# Patient Record
Sex: Female | Born: 1970 | Race: Black or African American | Hispanic: No | Marital: Single | Smoking: Never smoker
Health system: Southern US, Community
[De-identification: ages and names within clinical notes are randomized; demographics above are authoritative.]

## PROBLEM LIST (undated history)

## (undated) ENCOUNTER — Inpatient Hospital Stay (HOSPITAL_COMMUNITY): Payer: Self-pay

## (undated) DIAGNOSIS — R51 Headache: Secondary | ICD-10-CM

## (undated) DIAGNOSIS — I10 Essential (primary) hypertension: Secondary | ICD-10-CM

## (undated) DIAGNOSIS — K5900 Constipation, unspecified: Secondary | ICD-10-CM

## (undated) HISTORY — PX: NO PAST SURGERIES: SHX2092

---

## 1997-08-12 ENCOUNTER — Inpatient Hospital Stay (HOSPITAL_COMMUNITY): Admission: AD | Admit: 1997-08-12 | Discharge: 1997-08-12 | Payer: Self-pay | Admitting: Obstetrics

## 1997-08-13 ENCOUNTER — Inpatient Hospital Stay (HOSPITAL_COMMUNITY): Admission: AD | Admit: 1997-08-13 | Discharge: 1997-08-13 | Payer: Self-pay | Admitting: Obstetrics & Gynecology

## 1997-08-17 ENCOUNTER — Inpatient Hospital Stay (HOSPITAL_COMMUNITY): Admission: AD | Admit: 1997-08-17 | Discharge: 1997-08-17 | Payer: Self-pay | Admitting: Obstetrics

## 1997-11-09 ENCOUNTER — Inpatient Hospital Stay (HOSPITAL_COMMUNITY): Admission: AD | Admit: 1997-11-09 | Discharge: 1997-11-09 | Payer: Self-pay | Admitting: Obstetrics & Gynecology

## 1997-11-13 ENCOUNTER — Other Ambulatory Visit: Admission: RE | Admit: 1997-11-13 | Discharge: 1997-11-13 | Payer: Self-pay | Admitting: Obstetrics and Gynecology

## 1998-02-07 ENCOUNTER — Inpatient Hospital Stay (HOSPITAL_COMMUNITY): Admission: AD | Admit: 1998-02-07 | Discharge: 1998-02-07 | Payer: Self-pay | Admitting: Obstetrics and Gynecology

## 1998-03-08 ENCOUNTER — Ambulatory Visit (HOSPITAL_COMMUNITY): Admission: RE | Admit: 1998-03-08 | Discharge: 1998-03-08 | Payer: Self-pay | Admitting: Obstetrics and Gynecology

## 1998-03-10 ENCOUNTER — Ambulatory Visit (HOSPITAL_COMMUNITY): Admission: RE | Admit: 1998-03-10 | Discharge: 1998-03-10 | Payer: Self-pay | Admitting: Obstetrics and Gynecology

## 1998-05-20 ENCOUNTER — Inpatient Hospital Stay (HOSPITAL_COMMUNITY): Admission: AD | Admit: 1998-05-20 | Discharge: 1998-05-22 | Payer: Self-pay | Admitting: Obstetrics and Gynecology

## 1998-08-04 ENCOUNTER — Inpatient Hospital Stay (HOSPITAL_COMMUNITY): Admission: AD | Admit: 1998-08-04 | Discharge: 1998-08-04 | Payer: Self-pay | Admitting: Obstetrics

## 1998-11-18 ENCOUNTER — Inpatient Hospital Stay (HOSPITAL_COMMUNITY): Admission: AD | Admit: 1998-11-18 | Discharge: 1998-11-18 | Payer: Self-pay | Admitting: Obstetrics & Gynecology

## 1998-12-01 ENCOUNTER — Emergency Department (HOSPITAL_COMMUNITY): Admission: EM | Admit: 1998-12-01 | Discharge: 1998-12-01 | Payer: Self-pay | Admitting: Emergency Medicine

## 2000-09-27 ENCOUNTER — Encounter (INDEPENDENT_AMBULATORY_CARE_PROVIDER_SITE_OTHER): Payer: Self-pay | Admitting: Specialist

## 2000-09-27 ENCOUNTER — Other Ambulatory Visit: Admission: RE | Admit: 2000-09-27 | Discharge: 2000-09-27 | Payer: Self-pay | Admitting: Obstetrics and Gynecology

## 2001-08-07 ENCOUNTER — Other Ambulatory Visit: Admission: RE | Admit: 2001-08-07 | Discharge: 2001-08-07 | Payer: Self-pay | Admitting: Obstetrics and Gynecology

## 2002-08-27 ENCOUNTER — Other Ambulatory Visit: Admission: RE | Admit: 2002-08-27 | Discharge: 2002-08-27 | Payer: Self-pay | Admitting: Obstetrics and Gynecology

## 2002-08-31 ENCOUNTER — Emergency Department (HOSPITAL_COMMUNITY): Admission: EM | Admit: 2002-08-31 | Discharge: 2002-08-31 | Payer: Self-pay | Admitting: Emergency Medicine

## 2003-11-18 ENCOUNTER — Emergency Department (HOSPITAL_COMMUNITY): Admission: EM | Admit: 2003-11-18 | Discharge: 2003-11-18 | Payer: Self-pay | Admitting: Family Medicine

## 2004-04-05 ENCOUNTER — Other Ambulatory Visit: Admission: RE | Admit: 2004-04-05 | Discharge: 2004-04-05 | Payer: Self-pay | Admitting: Obstetrics and Gynecology

## 2004-09-21 ENCOUNTER — Encounter (INDEPENDENT_AMBULATORY_CARE_PROVIDER_SITE_OTHER): Payer: Self-pay | Admitting: *Deleted

## 2004-09-21 ENCOUNTER — Inpatient Hospital Stay (HOSPITAL_COMMUNITY): Admission: AD | Admit: 2004-09-21 | Discharge: 2004-09-23 | Payer: Self-pay | Admitting: Obstetrics and Gynecology

## 2004-10-15 ENCOUNTER — Emergency Department (HOSPITAL_COMMUNITY): Admission: EM | Admit: 2004-10-15 | Discharge: 2004-10-15 | Payer: Self-pay | Admitting: Emergency Medicine

## 2005-12-30 ENCOUNTER — Emergency Department (HOSPITAL_COMMUNITY): Admission: EM | Admit: 2005-12-30 | Discharge: 2005-12-31 | Payer: Self-pay | Admitting: Emergency Medicine

## 2006-04-19 ENCOUNTER — Emergency Department (HOSPITAL_COMMUNITY): Admission: EM | Admit: 2006-04-19 | Discharge: 2006-04-20 | Payer: Self-pay | Admitting: Emergency Medicine

## 2006-04-24 ENCOUNTER — Emergency Department (HOSPITAL_COMMUNITY): Admission: EM | Admit: 2006-04-24 | Discharge: 2006-04-24 | Payer: Self-pay | Admitting: Family Medicine

## 2007-06-10 ENCOUNTER — Emergency Department (HOSPITAL_COMMUNITY): Admission: EM | Admit: 2007-06-10 | Discharge: 2007-06-10 | Payer: Self-pay | Admitting: Emergency Medicine

## 2008-09-16 ENCOUNTER — Emergency Department (HOSPITAL_COMMUNITY): Admission: EM | Admit: 2008-09-16 | Discharge: 2008-09-16 | Payer: Self-pay | Admitting: Emergency Medicine

## 2010-03-20 ENCOUNTER — Emergency Department (HOSPITAL_COMMUNITY): Admission: EM | Admit: 2010-03-20 | Discharge: 2010-03-20 | Payer: Self-pay | Admitting: Emergency Medicine

## 2010-07-20 ENCOUNTER — Emergency Department (HOSPITAL_COMMUNITY)
Admission: EM | Admit: 2010-07-20 | Discharge: 2010-07-20 | Payer: Self-pay | Source: Home / Self Care | Admitting: Emergency Medicine

## 2010-07-20 LAB — POCT URINALYSIS DIPSTICK
Ketones, ur: NEGATIVE mg/dL
Nitrite: NEGATIVE
Protein, ur: NEGATIVE mg/dL
Urine Glucose, Fasting: NEGATIVE mg/dL
Urobilinogen, UA: 0.2 mg/dL (ref 0.0–1.0)

## 2010-09-08 LAB — URINALYSIS, ROUTINE W REFLEX MICROSCOPIC
Nitrite: NEGATIVE
Specific Gravity, Urine: 1.021 (ref 1.005–1.030)
Urobilinogen, UA: 0.2 mg/dL (ref 0.0–1.0)

## 2010-09-08 LAB — GC/CHLAMYDIA PROBE AMP, GENITAL: Chlamydia, DNA Probe: NEGATIVE

## 2010-09-08 LAB — URINE MICROSCOPIC-ADD ON

## 2010-09-08 LAB — POCT PREGNANCY, URINE: Preg Test, Ur: NEGATIVE

## 2010-09-08 LAB — WET PREP, GENITAL: Yeast Wet Prep HPF POC: NONE SEEN

## 2010-11-11 NOTE — Discharge Summary (Signed)
NAMEMANHATTAN, MCCUEN               ACCOUNT NO.:  [id number]   MEDICAL RECORD NO.:  [id number]          PATIENT TYPE:  INP   LOCATION:  9141                          FACILITY:  [facility]   PHYSICIAN:  Malachi Pro. Ambrose Mantle, M.D. DATE OF BIRTH:  02/21/1971   DATE OF ADMISSION:  09/21/2004  DATE OF DISCHARGE:  09/23/2004                                 DISCHARGE SUMMARY   This 40 year old black female para 1-0-4-1, gravida 6, EDC/ September 25, 2004,  presented to labor and delivery with complaints of spontaneous rupture of  membranes at 3 a.m. with green fluid noted.  Some mild contractions.  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  Otherwise, no  issues.  Positive group B strep status.  Blood group and type O positive,  negative antibody.  Nonreactive serology.  Rubella immune.  Hepatitis B  surface antigen negative. HIV declined.  GC and chlamydia negative.  One-  hour Glucola 132.  Group B strep positive.   PAST OBSTETRIC HISTORY:  In 1999, spontaneous delivery of 7 pound 9 ounce  infant.  Spontaneous abortions x2.  Early abortions x2.   GYNECOLOGICAL HISTORY:  History of colposcopy, normal Paps after that.  No  medical history or surgical history.   ALLERGIES:  No known allergies.   MEDICATIONS:  No medications.   PHYSICAL EXAMINATION:  On admission, the patient was afebrile with normal  vital signs.  Heart and lungs were normal.  Abdomen soft.  Fundal height  term size.  Cervix:  2 cm with 50% vertex, and a -2.  Thick green meconium  was noted.  Dr. Senaida Ores placed an intrauterine pressure catheter and gave  amnio infusion.  The patient was started on antibiotics for her positive  group B strep status.  The patient progressed to full dilatation and pushed  well.  The NICU was called because of the thick meconium.  The patient  delivered spontaneously ROA over an intact perineum a living female infant, 7  pounds 4 ounces.  Apgars of 8 at one and 9 at five minutes.  Apgars  were  signed by Dr. Ruben Gottron.  There was one loop of nuchal cord before  delivery of the body.  The nose and pharynx were suctioned with the DeLee  and the bulb, and approximately 4 mL of greenish-red fluid were obtained  with the DeLee.  Placenta was intact and meconium stained.  Uterus was  normal.  Blood loss about 300 mL.  One small abrasion on the right labia at  10 to 11 o'clock was hemostatic and not repaired.  Postpartum, the patient  did well and was discharged on the second postpartum day.  Initial  hemoglobin 12.3, hematocrit 36.7, white count 10,700, platelet count  342,000.  Followup hemoglobin 10.2, hematocrit 30.5, RPR nonreactive.   FINAL DIAGNOSES:  Intrauterine pregnancy at 39+ weeks, delivered OA.   OPERATION:  Spontaneous delivery OA.   FINAL CONDITION:  Improved.   DISCHARGE INSTRUCTIONS:  Instructions include our regular discharge  instruction booklet.  Percocet 5/325, 20 tablets, 1 every 4-6 hours as  needed for pain  is given at discharge.  The patient is advised to return in  six weeks for followup examination.      TFH/MEDQ  D:  09/23/2004  T:  09/23/2004  Job:  [id number]

## 2011-07-28 ENCOUNTER — Encounter (HOSPITAL_COMMUNITY): Payer: Self-pay

## 2011-07-28 ENCOUNTER — Inpatient Hospital Stay (HOSPITAL_COMMUNITY)
Admission: AD | Admit: 2011-07-28 | Discharge: 2011-07-28 | Disposition: A | Discharge status: Home / Self Care | Payer: Self-pay | Source: Ambulatory Visit | Attending: Obstetrics and Gynecology | Admitting: Obstetrics and Gynecology

## 2011-07-28 DIAGNOSIS — B9689 Other specified bacterial agents as the cause of diseases classified elsewhere: Secondary | ICD-10-CM | POA: Insufficient documentation

## 2011-07-28 DIAGNOSIS — A499 Bacterial infection, unspecified: Secondary | ICD-10-CM

## 2011-07-28 DIAGNOSIS — N760 Acute vaginitis: Secondary | ICD-10-CM | POA: Insufficient documentation

## 2011-07-28 DIAGNOSIS — N949 Unspecified condition associated with female genital organs and menstrual cycle: Secondary | ICD-10-CM | POA: Insufficient documentation

## 2011-07-28 LAB — URINALYSIS, ROUTINE W REFLEX MICROSCOPIC
Bilirubin Urine: NEGATIVE
Glucose, UA: NEGATIVE mg/dL
Leukocytes, UA: NEGATIVE
Nitrite: NEGATIVE
Protein, ur: NEGATIVE mg/dL
Specific Gravity, Urine: 1.02 (ref 1.005–1.030)
pH: 6 (ref 5.0–8.0)

## 2011-07-28 LAB — WET PREP, GENITAL
Trich, Wet Prep: NONE SEEN
Yeast Wet Prep HPF POC: NONE SEEN

## 2011-07-28 LAB — URINE MICROSCOPIC-ADD ON

## 2011-07-28 MED ORDER — METRONIDAZOLE 500 MG PO TABS: 500.0000 mg | ORAL_TABLET | Freq: Two times a day (BID) | ORAL | Status: AC

## 2011-07-28 NOTE — Progress Notes (Signed)
Has a discharge, with an odor.  First noted about a month ago.  Thought it was cycle, but that came and went again.

## 2011-07-28 NOTE — ED Provider Notes (Signed)
History     Chief Complaint  Patient presents with  . Vaginal Discharge   HPI Melody Salas 41 y.o. LMP 07-16-11, Has been having watery discharge with odor.  Has been wearing a pad.  No other complaints.   History reviewed. No pertinent past medical history.  History reviewed. No pertinent past surgical history.  History reviewed. No pertinent family history.  History  Substance Use Topics  . Smoking status: Never Smoker   . Smokeless tobacco: Not on file  . Alcohol Use: No    Allergies: No Known Allergies  Prescriptions prior to admission  Medication Sig Dispense Refill  . ibuprofen (ADVIL,MOTRIN) 200 MG tablet Take 800 mg by mouth every 6 (six) hours as needed. Takes for headaches        Review of Systems  Gastrointestinal: Negative for abdominal pain.  Genitourinary: Negative for dysuria.       Vaginal discharge   Physical Exam   Blood pressure 139/91, pulse 89, temperature 98.7 F (37.1 C), temperature source Oral, resp. rate 20, height 5\' 7"  (1.702 m), weight 170 lb (77.111 kg), last menstrual period 07/16/2011, SpO2 99.00%.  Physical Exam  Nursing note and vitals reviewed. Constitutional: She is oriented to person, place, and time. She appears well-developed and well-nourished.  HENT:  Head: Normocephalic.  Eyes: EOM are normal.  Neck: Neck supple.  GI: Soft. There is no tenderness.  Genitourinary:       Speculum exam: Vagina - large amount of watery yellow discharge, no odor Cervix - No contact bleeding Bimanual exam: Cervix closed Uterus non tender, normal size Adnexa non tender, no masses bilaterally GC/Chlam, wet prep done Chaperone present for exam.  Musculoskeletal: Normal range of motion.  Neurological: She is alert and oriented to person, place, and time.  Skin: Skin is warm and dry.  Psychiatric: She has a normal mood and affect.    MAU Course  Procedures  MDM Results for orders placed during the hospital encounter of 07/28/11  (from the past 24 hour(s))  URINALYSIS, ROUTINE W REFLEX MICROSCOPIC     Status: Abnormal   Collection Time   07/28/11  8:13 PM      Component Value Range   Color, Urine YELLOW  YELLOW    APPearance CLEAR  CLEAR    Specific Gravity, Urine 1.020  1.005 - 1.030    pH 6.0  5.0 - 8.0    Glucose, UA NEGATIVE  NEGATIVE (mg/dL)   Hgb urine dipstick TRACE (*) NEGATIVE    Bilirubin Urine NEGATIVE  NEGATIVE    Ketones, ur NEGATIVE  NEGATIVE (mg/dL)   Protein, ur NEGATIVE  NEGATIVE (mg/dL)   Urobilinogen, UA 1.0  0.0 - 1.0 (mg/dL)   Nitrite NEGATIVE  NEGATIVE    Leukocytes, UA NEGATIVE  NEGATIVE   URINE MICROSCOPIC-ADD ON     Status: Abnormal   Collection Time   07/28/11  8:13 PM      Component Value Range   Squamous Epithelial / LPF FEW (*) RARE    RBC / HPF 3-6  <3 (RBC/hpf)   Urine-Other MUCOUS PRESENT    WET PREP, GENITAL     Status: Abnormal   Collection Time   07/28/11  9:35 PM      Component Value Range   Yeast Wet Prep HPF POC NONE SEEN  NONE SEEN    Trich, Wet Prep NONE SEEN  NONE SEEN    Clue Cells Wet Prep HPF POC MANY (*) NONE SEEN    WBC,  Wet Prep HPF POC MODERATE (*) NONE SEEN      Assessment and Plan  Bacterial vaginosis  Plan Metronidazole 500 mg po bid x 7 days No alcohol while taking medication. See your doctor if you have any further problems.  Melody Salas 07/28/2011, 9:36 PM   Nolene Bernheim, NP 07/28/11 2232

## 2011-07-28 NOTE — Progress Notes (Signed)
Patient is here with c/o vaginal discharge with odor and itching. She denies any pain

## 2011-07-28 NOTE — ED Notes (Signed)
Called, not in lobby 

## 2011-07-29 LAB — GC/CHLAMYDIA PROBE AMP, GENITAL: Chlamydia, DNA Probe: NEGATIVE

## 2011-07-31 LAB — POCT PREGNANCY, URINE: Preg Test, Ur: NEGATIVE

## 2012-05-07 DIAGNOSIS — B9689 Other specified bacterial agents as the cause of diseases classified elsewhere: Secondary | ICD-10-CM

## 2012-05-07 DIAGNOSIS — A499 Bacterial infection, unspecified: Secondary | ICD-10-CM

## 2012-05-07 DIAGNOSIS — N760 Acute vaginitis: Secondary | ICD-10-CM

## 2012-05-08 ENCOUNTER — Encounter (HOSPITAL_COMMUNITY): Payer: Self-pay | Admitting: *Deleted

## 2012-05-08 ENCOUNTER — Inpatient Hospital Stay (HOSPITAL_COMMUNITY)
Admission: AD | Admit: 2012-05-08 | Discharge: 2012-05-08 | Disposition: A | Discharge status: Home / Self Care | Payer: Self-pay | Source: Ambulatory Visit | Attending: Obstetrics & Gynecology | Admitting: Obstetrics & Gynecology

## 2012-05-08 DIAGNOSIS — N760 Acute vaginitis: Secondary | ICD-10-CM | POA: Insufficient documentation

## 2012-05-08 DIAGNOSIS — B9689 Other specified bacterial agents as the cause of diseases classified elsewhere: Secondary | ICD-10-CM | POA: Insufficient documentation

## 2012-05-08 DIAGNOSIS — A499 Bacterial infection, unspecified: Secondary | ICD-10-CM | POA: Insufficient documentation

## 2012-05-08 DIAGNOSIS — L293 Anogenital pruritus, unspecified: Secondary | ICD-10-CM | POA: Insufficient documentation

## 2012-05-08 DIAGNOSIS — N949 Unspecified condition associated with female genital organs and menstrual cycle: Secondary | ICD-10-CM | POA: Insufficient documentation

## 2012-05-08 LAB — WET PREP, GENITAL: Yeast Wet Prep HPF POC: NONE SEEN

## 2012-05-08 MED ORDER — METRONIDAZOLE 500 MG PO TABS: 500.0000 mg | ORAL_TABLET | Freq: Two times a day (BID) | ORAL | Status: DC

## 2012-05-08 NOTE — MAU Provider Note (Signed)
History     CSN: [id number]  Arrival date and time: 05/08/12 1712   First Provider Initiated Contact with Patient 05/08/12 1756      Chief Complaint  Patient presents with  . Vaginal Discharge   HPI Melody Salas is a 41 y.o. female who presents to MAU with vaginal discharge. The discharge started over a week ago.  She describes the discharge as watery with a bad odor. Current sex partner for several years. No birth control. The history was provided by the patient.  OB History    Grav Para Term Preterm Abortions TAB SAB Ect Mult Living   4 2 2  2 2    2       History reviewed. No pertinent past medical history.  Past Surgical History  Procedure Date  . No past surgeries     Family History  Problem Relation Age of Onset  . Other Neg Hx     History  Substance Use Topics  . Smoking status: Never Smoker   . Smokeless tobacco: Not on file  . Alcohol Use: No    Allergies: No Known Allergies  Prescriptions prior to admission  Medication Sig Dispense Refill  . aspirin-acetaminophen-caffeine (EXCEDRIN MIGRAINE) 250-250-65 MG per tablet Take 2 tablets by mouth every 6 (six) hours as needed. headaches        Review of Systems  Constitutional: Negative for fever and chills.  Eyes: Negative for blurred vision and double vision.  Respiratory: Negative for cough and wheezing.   Cardiovascular: Negative for chest pain and palpitations.  Gastrointestinal: Negative for nausea, vomiting and abdominal pain.  Genitourinary: Negative for dysuria, urgency and frequency.       Vaginal discharge  Musculoskeletal: Negative for back pain.  Skin: Negative for rash.  Neurological: Negative for dizziness and headaches.  Psychiatric/Behavioral: Negative for depression. The patient is not nervous/anxious.    Physical Exam   Blood pressure 141/89, pulse 81, temperature 97.8 F (36.6 C), temperature source Oral, resp. rate 16, height 5\' 7"  (1.702 m), weight 77.168 kg (170 lb 2 oz),  last menstrual period 05/02/2012.  Physical Exam  Nursing note and vitals reviewed. Constitutional: She is oriented to person, place, and time. She appears well-developed and well-nourished. No distress.  HENT:  Head: Normocephalic and atraumatic.  Eyes: EOM are normal.  Neck: Neck supple.  Cardiovascular: Normal rate.   Respiratory: Effort normal.  GI: Soft. There is no tenderness.  Genitourinary:       External genitalia without lesions. Frothy malodorous discharge vaginal vault. No CMT, no adnexal tenderness. Uterus without palpable enlargement.  Musculoskeletal: Normal range of motion.  Neurological: She is alert and oriented to person, place, and time.  Skin: Skin is warm and dry.  Psychiatric: She has a normal mood and affect. Her behavior is normal. Judgment and thought content normal.   Results for orders placed during the hospital encounter of 05/08/12 (from the past 24 hour(s))  WET PREP, GENITAL     Status: Abnormal   Collection Time   05/08/12  6:05 PM      Component Value Range   Yeast Wet Prep HPF POC NONE SEEN  NONE SEEN   Trich, Wet Prep NONE SEEN  NONE SEEN   Clue Cells Wet Prep HPF POC FEW (*) NONE SEEN   WBC, Wet Prep HPF POC FEW (*) NONE SEEN   Assessment: 41 y.o. female with vaginal discharge   Bacterial vaginosis  Plan:  Rx Flagyl   Follow up with  GYN Clinic, return as needed. Discussed with the patient and all questioned fully answered. She will return if any problems arise.   Medication List     As of 05/09/2012  2:25 PM    START taking these medications         metroNIDAZOLE 500 MG tablet   Commonly known as: FLAGYL   Take 1 tablet (500 mg total) by mouth 2 (two) times daily.      CONTINUE taking these medications         aspirin-acetaminophen-caffeine 250-250-65 MG per tablet   Commonly known as: EXCEDRIN MIGRAINE          Where to get your medications    These are the prescriptions that you need to pick up. We sent them to a specific  pharmacy, so you will need to go there to get them.   [id number] - [place], [place] - [address] AT [facility] [address]    [address] [place] [address]    Phone: [phone]        metroNIDAZOLE 500 MG tablet           MAU Course  Procedures  NEESE,HOPE, RN, FNP, [facility] 05/08/2012, 5:57 PM

## 2012-05-08 NOTE — MAU Note (Signed)
Pt states has thin watery discharge x1-2 weeks with odor noted. Some itching externally. Has had bv before and thinks this may be the same infection.

## 2012-05-10 LAB — GC/CHLAMYDIA PROBE AMP, GENITAL: Chlamydia, DNA Probe: NEGATIVE

## 2012-09-28 ENCOUNTER — Inpatient Hospital Stay (HOSPITAL_COMMUNITY)
Admission: AD | Admit: 2012-09-28 | Discharge: 2012-09-28 | Disposition: A | Discharge status: Home / Self Care | Payer: Self-pay | Source: Ambulatory Visit | Attending: Family Medicine | Admitting: Family Medicine

## 2012-09-28 ENCOUNTER — Encounter (HOSPITAL_COMMUNITY): Payer: Self-pay | Admitting: *Deleted

## 2012-09-28 DIAGNOSIS — N760 Acute vaginitis: Secondary | ICD-10-CM | POA: Insufficient documentation

## 2012-09-28 DIAGNOSIS — B9689 Other specified bacterial agents as the cause of diseases classified elsewhere: Secondary | ICD-10-CM | POA: Insufficient documentation

## 2012-09-28 DIAGNOSIS — A499 Bacterial infection, unspecified: Secondary | ICD-10-CM

## 2012-09-28 DIAGNOSIS — N899 Noninflammatory disorder of vagina, unspecified: Secondary | ICD-10-CM | POA: Insufficient documentation

## 2012-09-28 HISTORY — DX: Headache: R51

## 2012-09-28 LAB — URINALYSIS, ROUTINE W REFLEX MICROSCOPIC
Ketones, ur: NEGATIVE mg/dL
Protein, ur: NEGATIVE mg/dL
Specific Gravity, Urine: 1.015 (ref 1.005–1.030)
Urobilinogen, UA: 0.2 mg/dL (ref 0.0–1.0)

## 2012-09-28 LAB — WET PREP, GENITAL
Trich, Wet Prep: NONE SEEN
Yeast Wet Prep HPF POC: NONE SEEN

## 2012-09-28 LAB — URINE MICROSCOPIC-ADD ON

## 2012-09-28 MED ORDER — METRONIDAZOLE 500 MG PO TABS: 500.0000 mg | ORAL_TABLET | Freq: Two times a day (BID) | ORAL | Status: DC

## 2012-09-28 NOTE — MAU Provider Note (Signed)
Chart reviewed and agree with management and plan.  

## 2012-09-28 NOTE — MAU Note (Signed)
Pt. Here due to urgency, odor and vaginal irritation. Symptoms started 3-4 days ago. Denies hx. Of UTI. Denies bleeding. Urine pregnancy negative.

## 2012-09-28 NOTE — MAU Provider Note (Signed)
History     CSN: [id number]  Arrival date and time: 09/28/12 1825   First Provider Initiated Contact with Patient 09/28/12 1904      No chief complaint on file.  HPI [name] is a 42 y.o. [phone] who presents to MAU today with complaint of vaginal irritation. The patient states that this has been going on occasionally x 3-4 days. She has noticed a yellow, thin discharge. She denies vaginal bleeding, pelvic pain, N/V or fever. She is sexually active but uses condoms. LMP 09/04/12. She denies UTI symptoms.   OB History   Grav Para Term Preterm Abortions TAB SAB Ect Mult Living   4 2 2  2 2    2       Past Medical History  Diagnosis Date  . Headache     Past Surgical History  Procedure Laterality Date  . No past surgeries      Family History  Problem Relation Age of Onset  . Other Neg Hx   . Hypertension Mother     History  Substance Use Topics  . Smoking status: Never Smoker   . Smokeless tobacco: Not on file  . Alcohol Use: No    Allergies: No Known Allergies  Prescriptions prior to admission  Medication Sig Dispense Refill  . loratadine (CLARITIN) 10 MG tablet Take 10 mg by mouth daily as needed for allergies.        Review of Systems  Constitutional: Negative for fever and malaise/fatigue.  Gastrointestinal: Negative for nausea, vomiting and abdominal pain.  Genitourinary: Negative for dysuria, urgency and frequency.       + vaginal discharge Neg - vaginal bleeding   Physical Exam   Blood pressure 131/73, pulse 90, temperature 99 F (37.2 C), temperature source Oral, resp. rate 18, height 5\' 7"  (1.702 m), weight 170 lb (77.111 kg), last menstrual period 09/04/2012.  Physical Exam  Constitutional: She is oriented to person, place, and time. She appears well-developed and well-nourished. No distress.  HENT:  Head: Normocephalic and atraumatic.  Cardiovascular: Normal rate, regular rhythm and normal heart sounds.   Respiratory: Effort normal  and breath sounds normal. No respiratory distress.  GI: Soft. Bowel sounds are normal. She exhibits no distension and no mass. There is no tenderness. There is no rebound and no guarding.  Genitourinary: Uterus is not enlarged and not tender. Cervix exhibits discharge (small amount of white mucus discharge noted). Cervix exhibits no motion tenderness and no friability. Right adnexum displays no mass and no tenderness. Left adnexum displays no mass and no tenderness. No erythema, tenderness or bleeding around the vagina. Vaginal discharge (thin, white discharge noted) found.  Neurological: She is alert and oriented to person, place, and time.  Skin: Skin is warm and dry. No erythema.   Results for orders placed during the hospital encounter of 09/28/12 (from the past 24 hour(s))  URINALYSIS, ROUTINE W REFLEX MICROSCOPIC     Status: Abnormal   Collection Time    09/28/12  6:45 PM      Result Value Range   Color, Urine YELLOW  YELLOW   APPearance CLEAR  CLEAR   Specific Gravity, Urine 1.015  1.005 - 1.030   pH 6.0  5.0 - 8.0   Glucose, UA NEGATIVE  NEGATIVE mg/dL   Hgb urine dipstick TRACE (*) NEGATIVE   Bilirubin Urine NEGATIVE  NEGATIVE   Ketones, ur NEGATIVE  NEGATIVE mg/dL   Protein, ur NEGATIVE  NEGATIVE mg/dL   Urobilinogen, UA 0.2  0.0 - 1.0 mg/dL   Nitrite NEGATIVE  NEGATIVE   Leukocytes, UA SMALL (*) NEGATIVE  URINE MICROSCOPIC-ADD ON     Status: Abnormal   Collection Time    09/28/12  6:45 PM      Result Value Range   Squamous Epithelial / LPF MANY (*) RARE   WBC, UA 3-6  <3 WBC/hpf   RBC / HPF 0-2  <3 RBC/hpf   Bacteria, UA FEW (*) RARE  WET PREP, GENITAL     Status: Abnormal   Collection Time    09/28/12  7:20 PM      Result Value Range   Yeast Wet Prep HPF POC NONE SEEN  NONE SEEN   Trich, Wet Prep NONE SEEN  NONE SEEN   Clue Cells Wet Prep HPF POC FEW (*) NONE SEEN   WBC, Wet Prep HPF POC MODERATE (*) NONE SEEN     MAU Course  Procedures None  MDM UA, wet  prep and GC/Chlamydia today  Assessment and Plan  A: Bacterial vaginosis  P: Discharge home Rx for Flagyl sent to patient's pharmacy Discussed hygiene products and probiotics for avoiding recurrence Patient may return to MAU as needed or if her condition were to change or worsen  Freddi Starr, PA-C  09/28/2012, 8:01 PM

## 2012-09-28 NOTE — Progress Notes (Signed)
MAU provider in room. Cultures collected and exam done.

## 2012-09-30 LAB — GC/CHLAMYDIA PROBE AMP: CT Probe RNA: NEGATIVE

## 2012-09-30 LAB — URINE CULTURE

## 2013-02-24 ENCOUNTER — Inpatient Hospital Stay (HOSPITAL_COMMUNITY)
Admission: AD | Admit: 2013-02-24 | Discharge: 2013-02-24 | Discharge status: Against Medical Advice | Payer: Self-pay | Source: Ambulatory Visit | Attending: Obstetrics and Gynecology | Admitting: Obstetrics and Gynecology

## 2013-02-24 ENCOUNTER — Inpatient Hospital Stay (HOSPITAL_COMMUNITY)
Admission: AD | Admit: 2013-02-24 | Discharge: 2013-02-24 | Disposition: A | Discharge status: Home / Self Care | Payer: Self-pay | Source: Ambulatory Visit | Attending: Obstetrics and Gynecology | Admitting: Obstetrics and Gynecology

## 2013-02-24 ENCOUNTER — Encounter (HOSPITAL_COMMUNITY): Payer: Self-pay | Admitting: *Deleted

## 2013-02-24 DIAGNOSIS — A499 Bacterial infection, unspecified: Secondary | ICD-10-CM

## 2013-02-24 DIAGNOSIS — N760 Acute vaginitis: Secondary | ICD-10-CM | POA: Insufficient documentation

## 2013-02-24 DIAGNOSIS — L293 Anogenital pruritus, unspecified: Secondary | ICD-10-CM | POA: Insufficient documentation

## 2013-02-24 DIAGNOSIS — N949 Unspecified condition associated with female genital organs and menstrual cycle: Secondary | ICD-10-CM | POA: Insufficient documentation

## 2013-02-24 DIAGNOSIS — B9689 Other specified bacterial agents as the cause of diseases classified elsewhere: Secondary | ICD-10-CM | POA: Insufficient documentation

## 2013-02-24 LAB — WET PREP, GENITAL: Trich, Wet Prep: NONE SEEN

## 2013-02-24 MED ORDER — METRONIDAZOLE 500 MG PO TABS: 500.0000 mg | ORAL_TABLET | Freq: Two times a day (BID) | ORAL | Status: DC

## 2013-02-24 NOTE — MAU Note (Signed)
Pt C/o headaches twice monthly for past yr; and thin, yellow, odorous discharge for the past 2 wks , started with itching this morning.  Sexually active, denies using birth control, no dysuria.

## 2013-02-24 NOTE — MAU Provider Note (Signed)
History     CSN: [id number]  Arrival date and time: 02/24/13 1755   First Provider Initiated Contact with Patient 02/24/13 1836      No chief complaint on file.  HPI [name] is a 42 y.o. [phone] who presents to MAU today with vaginal discharge x 2 weeks and vaginal itching that started this morning. The patient states discharge is thin, yellow and malodorous. She denies rash of the external genitalia, pelvic pain, N/V, UTI symptoms or fever.   OB History   Grav Para Term Preterm Abortions TAB SAB Ect Mult Living   4 2 2  2 2    2       Past Medical History  Diagnosis Date  . [id number](213.0)     Past Surgical History  Procedure Laterality Date  . No past surgeries      Family History  Problem Relation Age of Onset  . Other Neg Hx   . Hypertension Mother   . Cancer Maternal Grandmother     History  Substance Use Topics  . Smoking status: Never Smoker   . Smokeless tobacco: Not on file  . Alcohol Use: No    Allergies: No Known Allergies  No prescriptions prior to admission    Review of Systems  Constitutional: Negative for fever and malaise/fatigue.  Gastrointestinal: Negative for nausea, vomiting and abdominal pain.  Genitourinary: Negative for dysuria, urgency and frequency.       + vaginal discharge, bleeding   Physical Exam   Blood pressure 123/62, pulse 78, temperature 98 F (36.7 C), temperature source Oral, resp. rate 16, height 5\' 7"  (1.702 m), weight 171 lb (77.565 kg), last menstrual period 01/15/2013.  Physical Exam  Constitutional: She is oriented to person, place, and time. She appears well-developed and well-nourished. No distress.  HENT:  Head: Normocephalic and atraumatic.  Cardiovascular: Normal rate, regular rhythm and normal heart sounds.   Respiratory: Effort normal and breath sounds normal. No respiratory distress.  GI: Soft. Bowel sounds are normal. She exhibits no distension and no mass. There is no tenderness. There is  no rebound and no guarding.  Genitourinary: Uterus is not enlarged and not tender. Cervix exhibits no motion tenderness, no discharge and no friability. Right adnexum displays no mass and no tenderness. Left adnexum displays no mass and no tenderness. There is bleeding (scant bleeding) around the vagina. Vaginal discharge (small amount of malodorous pink discharge noted) found.  Neurological: She is alert and oriented to person, place, and time.  Skin: Skin is warm and dry. No erythema.  Psychiatric: She has a normal mood and affect.   Results for orders placed during the hospital encounter of 02/24/13 (from the past 24 hour(s))  WET PREP, GENITAL     Status: Abnormal   Collection Time    02/24/13  6:45 PM      Result Value Range   Yeast Wet Prep HPF POC NONE SEEN  NONE SEEN   Trich, Wet Prep NONE SEEN  NONE SEEN   Clue Cells Wet Prep HPF POC FEW (*) NONE SEEN   WBC, Wet Prep HPF POC FEW (*) NONE SEEN    MAU Course  Procedures None  MDM Discussed with Dr. Ellyn Hack, ok to treat wet prep results and follow-up in the office PRN Wet prep, GC/Chlmaydia today  Assessment and Plan  A: Bacterial vaginosis  P: Discharge home Rx for Flagyl sent to patient's pharmacy Discussed hygiene products for avoiding recurrent BV GC/Chlamydia pending Patient advised to  follow-up in the office as needed or if symptoms persist or worsen Patient may return to MAU as needed   Freddi Starr, PA-C  02/24/2013, 7:53 PM

## 2013-02-25 LAB — GC/CHLAMYDIA PROBE AMP
CT Probe RNA: NEGATIVE
GC Probe RNA: NEGATIVE

## 2013-03-24 ENCOUNTER — Emergency Department (INDEPENDENT_AMBULATORY_CARE_PROVIDER_SITE_OTHER)
Admission: EM | Admit: 2013-03-24 | Discharge: 2013-03-24 | Disposition: A | Discharge status: Home / Self Care | Payer: Self-pay | Source: Home / Self Care | Attending: Family Medicine | Admitting: Family Medicine

## 2013-03-24 ENCOUNTER — Encounter (HOSPITAL_COMMUNITY): Payer: Self-pay | Admitting: Emergency Medicine

## 2013-03-24 DIAGNOSIS — J039 Acute tonsillitis, unspecified: Secondary | ICD-10-CM

## 2013-03-24 LAB — POCT RAPID STREP A: Streptococcus, Group A Screen (Direct): NEGATIVE

## 2013-03-24 MED ORDER — IBUPROFEN 800 MG PO TABS
ORAL_TABLET | ORAL | Status: AC
  Filled 2013-03-24: qty 1

## 2013-03-24 MED ORDER — IBUPROFEN 600 MG PO TABS: 600.0000 mg | ORAL_TABLET | Freq: Three times a day (TID) | ORAL | Status: DC

## 2013-03-24 MED ORDER — HYDROCODONE-ACETAMINOPHEN 5-325 MG PO TABS: 1.0000 | ORAL_TABLET | Freq: Three times a day (TID) | ORAL | Status: DC | PRN

## 2013-03-24 MED ORDER — IBUPROFEN 800 MG PO TABS
800.0000 mg | ORAL_TABLET | Freq: Once | ORAL | Status: AC
  Administered 2013-03-24: 800 mg via ORAL

## 2013-03-24 MED ORDER — CEPHALEXIN 500 MG PO CAPS: 500.0000 mg | ORAL_CAPSULE | Freq: Three times a day (TID) | ORAL | Status: DC

## 2013-03-24 NOTE — ED Provider Notes (Signed)
CSN: [id number]     Arrival date & time 03/24/13  1053 History   First MD Initiated Contact with Patient 03/24/13 1150     Chief Complaint  Patient presents with  . Sore Throat    swollen tonsils. fever. chills   (Consider location/radiation/quality/duration/timing/severity/associated sxs/prior Treatment) HPI Comments: 42 year old female with no significant past medical history. Here complaining of sore throat, headache and painful swallowing since yesterday. Symptoms worsening today and associated with chills and fever up to 102. Denies cough, wheezing, chest pain or difficulty breathing. No nausea vomiting or diarrhea. No abdominal pain.   Past Medical History  Diagnosis Date  . [id number](478.2)    Past Surgical History  Procedure Laterality Date  . No past surgeries     Family History  Problem Relation Age of Onset  . Other Neg Hx   . Hypertension Mother   . Cancer Maternal Grandmother    History  Substance Use Topics  . Smoking status: Never Smoker   . Smokeless tobacco: Not on file  . Alcohol Use: No   OB History   Grav Para Term Preterm Abortions TAB SAB Ect Mult Living   4 2 2  2 2    2      Review of Systems  Constitutional: Positive for fever and chills.  HENT: Positive for sore throat and trouble swallowing. Negative for congestion.   Respiratory: Negative for cough, shortness of breath and wheezing.   Cardiovascular: Negative for chest pain and leg swelling.  Gastrointestinal: Negative for nausea, vomiting, abdominal pain and diarrhea.  Skin: Negative for rash.  Neurological: Positive for headaches.  All other systems reviewed and are negative.    Allergies  Review of patient's allergies indicates no known allergies.  Home Medications   Current Outpatient Rx  Name  Route  Sig  Dispense  Refill  . aspirin-acetaminophen-caffeine (EXCEDRIN MIGRAINE) 250-250-65 MG per tablet   Oral   Take 2 tablets by mouth daily as needed (For headache.).         Marland Kitchen  calcium carbonate (TUMS - DOSED IN MG ELEMENTAL CALCIUM) 500 MG chewable tablet   Oral   Chew 2 tablets by mouth at bedtime as needed for heartburn.         . cephALEXin (KEFLEX) 500 MG capsule   Oral   Take 1 capsule (500 mg total) by mouth 3 (three) times daily.   21 capsule   0   . HYDROcodone-acetaminophen (NORCO/VICODIN) 5-325 MG per tablet   Oral   Take 1 tablet by mouth every 8 (eight) hours as needed for pain.   10 tablet   0   . ibuprofen (ADVIL,MOTRIN) 600 MG tablet   Oral   Take 1 tablet (600 mg total) by mouth 3 (three) times daily.   30 tablet   0    BP 140/81  Pulse 117  Temp(Src) 102.4 F (39.1 C) (Oral)  Resp 17  SpO2 100%  LMP 02/18/2013 Physical Exam  Nursing note and vitals reviewed. Constitutional: She is oriented to person, place, and time. She appears well-developed and well-nourished. No distress.  HENT:  Head: Normocephalic and atraumatic.  Nasal Congestion with erythema and swelling of nasal turbinates, no rhinorrhea. Significant pharyngeal and tonsillar erythema with exudates. No uvula deviation. No trismus. TM's with increased vascular markings and some dullness bilaterally no swelling or bulging  Eyes: Conjunctivae and EOM are normal. Pupils are equal, round, and reactive to light. Right eye exhibits no discharge. Left eye exhibits no discharge. No  scleral icterus.  Neck: Neck supple. No JVD present. No thyromegaly present.  Cardiovascular: Normal rate, regular rhythm and normal heart sounds.   Pulmonary/Chest: Effort normal and breath sounds normal. No respiratory distress. She has no wheezes. She has no rales. She exhibits no tenderness.  Abdominal: Soft.  No hepatosplenomegaly  Lymphadenopathy:    She has cervical adenopathy.  Neurological: She is alert and oriented to person, place, and time.  Skin: No rash noted. She is not diaphoretic.    ED Course  Procedures (including critical care time) Labs Review Labs Reviewed  POCT RAPID  STREP A ([facility]RG CARE ONLY)   Imaging Review No results found.  MDM   1. Tonsillitis with exudate    negative rapid strep sample sent for culture. Treated with cephalexin, ibuprofen, hydrocodone/acetaminophen 5/325 mg #10 tablets Supportive care and red flags that should prompt her return to medical attention discussed with patient and provided in writing. A  Sharin Grave, MD 03/25/13 1447

## 2013-03-24 NOTE — ED Notes (Signed)
C/o sore throat. Trouble swallowing. Fever. Headache and cold chills. States woke from sleep with symptoms last night. Denies n/v/d. Pt has not tried any otc meds for symptoms.

## 2013-03-26 LAB — CULTURE, GROUP A STREP

## 2013-03-26 NOTE — ED Notes (Signed)
Throat culture: Strep beta hemolytic not Group A.  Pt. adequately treated with Keflex. Vassie Moselle 03/26/2013

## 2013-09-08 ENCOUNTER — Inpatient Hospital Stay (HOSPITAL_COMMUNITY)
Admission: AD | Admit: 2013-09-08 | Discharge: 2013-09-08 | Disposition: A | Discharge status: Home / Self Care | Payer: BC Managed Care – PPO | Source: Ambulatory Visit | Attending: Obstetrics & Gynecology | Admitting: Obstetrics & Gynecology

## 2013-09-08 ENCOUNTER — Encounter (HOSPITAL_COMMUNITY): Payer: Self-pay | Admitting: *Deleted

## 2013-09-08 DIAGNOSIS — N760 Acute vaginitis: Secondary | ICD-10-CM

## 2013-09-08 DIAGNOSIS — B9689 Other specified bacterial agents as the cause of diseases classified elsewhere: Secondary | ICD-10-CM

## 2013-09-08 DIAGNOSIS — R3915 Urgency of urination: Secondary | ICD-10-CM | POA: Insufficient documentation

## 2013-09-08 DIAGNOSIS — A499 Bacterial infection, unspecified: Secondary | ICD-10-CM

## 2013-09-08 HISTORY — DX: Constipation, unspecified: K59.00

## 2013-09-08 LAB — URINALYSIS, ROUTINE W REFLEX MICROSCOPIC
Bilirubin Urine: NEGATIVE
Glucose, UA: NEGATIVE mg/dL
KETONES UR: NEGATIVE mg/dL
LEUKOCYTES UA: NEGATIVE
NITRITE: NEGATIVE
PROTEIN: NEGATIVE mg/dL
Specific Gravity, Urine: 1.025 (ref 1.005–1.030)
UROBILINOGEN UA: 0.2 mg/dL (ref 0.0–1.0)
pH: 6 (ref 5.0–8.0)

## 2013-09-08 LAB — URINE MICROSCOPIC-ADD ON

## 2013-09-08 LAB — POCT PREGNANCY, URINE: Preg Test, Ur: NEGATIVE

## 2013-09-08 LAB — WET PREP, GENITAL
Trich, Wet Prep: NONE SEEN
Yeast Wet Prep HPF POC: NONE SEEN

## 2013-09-08 MED ORDER — METRONIDAZOLE 500 MG PO TABS: 500.0000 mg | ORAL_TABLET | Freq: Two times a day (BID) | ORAL | Status: DC

## 2013-09-08 NOTE — MAU Note (Signed)
C/O urgency to pee since last night. Also feels vaginal irritation and odorous yellow discharge. Not concerned with STD's. States she is not sexually active at this time.

## 2013-09-08 NOTE — MAU Provider Note (Signed)
History     CSN: [id number]  Arrival date and time: 09/08/13 1801   First Provider [id number] Contact with Patient 09/08/13 1828      Chief Complaint  Patient presents with  . Urinary Frequency   HPI Comments: Melody Salas 43 y.o. [id number] presents to MAU with odorous vaginal discharge and urinary urgency. These sx have been ongoing since last night. She has not been sexually active for one year. [facility]. Just finished her menses.  Urinary Frequency  Associated symptoms include frequency.      Past Medical History  Diagnosis Date  . Headache(784.0)   . Constipation     Past Surgical History  Procedure Laterality Date  . No past surgeries    . Vaginal delivery      Family History  Problem Relation Age of Onset  . Other Neg Hx   . Hypertension Mother   . Cancer Maternal Grandmother     History  Substance Use Topics  . Smoking status: Never Smoker   . Smokeless tobacco: Not on file  . Alcohol Use: No    Allergies: No Known Allergies  Prescriptions prior to admission  Medication Sig Dispense Refill  . aspirin-acetaminophen-caffeine (EXCEDRIN MIGRAINE) 250-250-65 MG per tablet Take 2 tablets by mouth daily as needed (For headache.).      Marland Kitchen. calcium carbonate (TUMS - DOSED IN MG ELEMENTAL CALCIUM) 500 MG chewable tablet Chew 2 tablets by mouth at bedtime as needed for heartburn.        Review of Systems  Constitutional: Negative.   HENT: Negative.   Eyes: Negative.   Respiratory: Negative.   Cardiovascular: Negative.   Gastrointestinal: Negative.   Genitourinary: Positive for frequency.       Vaginal discharge  Musculoskeletal: Negative.   Skin: Negative.   Neurological: Negative.   Psychiatric/Behavioral: Negative.    Physical Exam   Blood pressure 147/77, pulse 72, temperature 98.1 F (36.7 C), temperature source Oral, resp. rate 18, height 5' 5.5" (1.664 m), weight 83.915 kg (185 lb), last menstrual period  09/01/2013.  Physical Exam  Constitutional: She is oriented to person, place, and time. She appears well-developed and well-nourished. No distress.  HENT:  Head: Normocephalic and atraumatic.  Eyes: Pupils are equal, round, and reactive to light.  GI: Soft. Bowel sounds are normal. She exhibits no distension and no mass. There is no tenderness. There is no rebound and no guarding.  Genitourinary:  Genital:External negative Vaginal:thin white discharge Cervix:closed  And long Bimanual:Nontender   Musculoskeletal: Normal range of motion.  Neurological: She is alert and oriented to person, place, and time.  Skin: Skin is warm and dry.  Psychiatric: She has a normal mood and affect. Her behavior is normal. Judgment and thought content normal.   Results for orders placed during the hospital encounter of 09/08/13 (from the past 24 hour(s))  URINALYSIS, ROUTINE W REFLEX MICROSCOPIC     Status: Abnormal   Collection Time    09/08/13  6:12 PM      Result Value Ref Range   Color, Urine YELLOW  YELLOW   APPearance CLEAR  CLEAR   Specific Gravity, Urine 1.025  1.005 - 1.030   pH 6.0  5.0 - 8.0   Glucose, UA NEGATIVE  NEGATIVE mg/dL   Hgb urine dipstick TRACE (*) NEGATIVE   Bilirubin Urine NEGATIVE  NEGATIVE   Ketones, ur NEGATIVE  NEGATIVE mg/dL   Protein, ur NEGATIVE  NEGATIVE mg/dL   Urobilinogen,  UA 0.2  0.0 - 1.0 mg/dL   Nitrite NEGATIVE  NEGATIVE   Leukocytes, UA NEGATIVE  NEGATIVE  URINE MICROSCOPIC-ADD ON     Status: Abnormal   Collection Time    09/08/13  6:12 PM      Result Value Ref Range   Squamous Epithelial / LPF RARE  RARE   WBC, UA 0-2  <3 WBC/hpf   RBC / HPF 3-6  <3 RBC/hpf   Bacteria, UA FEW (*) RARE   Urine-Other MUCOUS PRESENT    POCT PREGNANCY, URINE     Status: None   Collection Time    09/08/13  6:28 PM      Result Value Ref Range   Preg Test, Ur NEGATIVE  NEGATIVE  WET PREP, GENITAL     Status: Abnormal   Collection Time    09/08/13  6:48 PM      Result  Value Ref Range   Yeast Wet Prep HPF POC NONE SEEN  NONE SEEN   Trich, Wet Prep NONE SEEN  NONE SEEN   Clue Cells Wet Prep HPF POC FEW (*) NONE SEEN   WBC, Wet Prep HPF POC FEW (*) NONE SEEN    MAU Course  Procedures  MDM  Wet prep, GC, chlamydia, UA  Assessment and Plan   A: Bacterial vaginosis  P: Flagyl 500 mg po BID x 7 days No alcohol for one week Make appointment with [facility] for pap smear  Carolynn Serve 09/08/2013, 7:21 PM

## 2013-09-08 NOTE — Discharge Instructions (Signed)

## 2013-09-09 LAB — GC/CHLAMYDIA PROBE AMP
CT Probe RNA: NEGATIVE
GC PROBE AMP APTIMA: NEGATIVE

## 2013-10-07 ENCOUNTER — Encounter (HOSPITAL_COMMUNITY): Payer: Self-pay | Admitting: Emergency Medicine

## 2013-10-07 ENCOUNTER — Emergency Department (HOSPITAL_COMMUNITY)
Admission: EM | Admit: 2013-10-07 | Discharge: 2013-10-07 | Disposition: A | Discharge status: Home / Self Care | Payer: BC Managed Care – PPO | Source: Home / Self Care | Attending: Family Medicine | Admitting: Family Medicine

## 2013-10-07 DIAGNOSIS — H6990 Unspecified Eustachian tube disorder, unspecified ear: Secondary | ICD-10-CM

## 2013-10-07 DIAGNOSIS — H9209 Otalgia, unspecified ear: Secondary | ICD-10-CM

## 2013-10-07 DIAGNOSIS — H6980 Other specified disorders of Eustachian tube, unspecified ear: Secondary | ICD-10-CM

## 2013-10-07 MED ORDER — FLUTICASONE PROPIONATE 50 MCG/ACT NA SUSP: 2.0000 | Freq: Every day | NASAL | Status: DC

## 2013-10-07 MED ORDER — ANTIPYRINE-BENZOCAINE 5.4-1.4 % OT SOLN: 3.0000 [drp] | OTIC | Status: DC | PRN

## 2013-10-07 NOTE — ED Notes (Signed)
Left ear ache, onset 3 am

## 2013-10-07 NOTE — ED Provider Notes (Signed)
Melody Salas is a 43 y.o. female who presents to Urgent Care today for left ear pain. Pain starting this morning and is associated with sneezing and runny nose. No fevers chills nausea vomiting or diarrhea. No tinnitus or decreased hearing. No treatments tried. Symptoms are moderate. Patient feels well otherwise.   Past Medical History  Diagnosis Date  . Headache(784.0)   . Constipation    History  Substance Use Topics  . Smoking status: Never Smoker   . Smokeless tobacco: Not on file  . Alcohol Use: No   ROS as above Medications: No current facility-administered medications for this encounter.   Current Outpatient Prescriptions  Medication Sig Dispense Refill  . antipyrine-benzocaine (AURALGAN) otic solution Place 3-4 drops into the left ear every 2 (two) hours as needed for ear pain.  10 mL  0  . aspirin-acetaminophen-caffeine (EXCEDRIN MIGRAINE) 250-250-65 MG per tablet Take 2 tablets by mouth daily as needed (For headache.).      Marland Kitchen. calcium carbonate (TUMS - DOSED IN MG ELEMENTAL CALCIUM) 500 MG chewable tablet Chew 2 tablets by mouth at bedtime as needed for heartburn.      . fluticasone (FLONASE) 50 MCG/ACT nasal spray Place 2 sprays into both nostrils daily.  16 g  2    Exam:  BP 126/75  Pulse 76  Temp(Src) 98.7 F (37.1 C) (Oral)  Resp 16  SpO2 100%  LMP 09/01/2013 Gen: Well NAD HEENT: EOMI,  MMM left tympanic membrane is retracted but otherwise normal appearing. Right tympanic membrane is normal. Posterior pharynx with cobblestoning. Lungs: Normal work of breathing. CTABL Heart: RRR no MRG Abd: NABS, Soft. NT, ND Exts: Brisk capillary refill, warm and well perfused.   No results found for this or any previous visit (from the past 24 hour(s)). No results found.  Assessment and Plan: 43 y.o. female with otalgia secondary to eustachian tube dysfunction due to seasonal allergies. Plan to treat with antihistamine decongestants, Flonase nasal spray, and Auralgan ear  drops.  Discussed warning signs or symptoms. Please see discharge instructions. Patient expresses understanding.    Rodolph BongEvan S Maleena Eddleman, MD 10/07/13 [phone]

## 2013-10-07 NOTE — Discharge Instructions (Signed)
Thank you for coming in today. Use  Zyrtec D. Daily.  Use Auralgan ear drops every 2 hours as needed for pain.  Use Flonase nasal spray.  Call or go to the emergency room if you get worse, have trouble breathing, have chest pains, or palpitations.

## 2014-04-27 ENCOUNTER — Encounter (HOSPITAL_COMMUNITY): Payer: Self-pay | Admitting: Emergency Medicine

## 2015-06-01 ENCOUNTER — Inpatient Hospital Stay (HOSPITAL_COMMUNITY): Payer: 59

## 2015-06-01 ENCOUNTER — Inpatient Hospital Stay (HOSPITAL_COMMUNITY)
Admission: AD | Admit: 2015-06-01 | Discharge: 2015-06-01 | Disposition: A | Discharge status: Home / Self Care | Payer: 59 | Source: Ambulatory Visit | Attending: Obstetrics and Gynecology | Admitting: Obstetrics and Gynecology

## 2015-06-01 ENCOUNTER — Encounter (HOSPITAL_COMMUNITY): Payer: Self-pay

## 2015-06-01 DIAGNOSIS — O209 Hemorrhage in early pregnancy, unspecified: Secondary | ICD-10-CM

## 2015-06-01 DIAGNOSIS — Z3A01 Less than 8 weeks gestation of pregnancy: Secondary | ICD-10-CM | POA: Diagnosis not present

## 2015-06-01 DIAGNOSIS — O26851 Spotting complicating pregnancy, first trimester: Secondary | ICD-10-CM

## 2015-06-01 DIAGNOSIS — O200 Threatened abortion: Secondary | ICD-10-CM

## 2015-06-01 DIAGNOSIS — O26899 Other specified pregnancy related conditions, unspecified trimester: Secondary | ICD-10-CM

## 2015-06-01 DIAGNOSIS — N760 Acute vaginitis: Secondary | ICD-10-CM

## 2015-06-01 DIAGNOSIS — R109 Unspecified abdominal pain: Secondary | ICD-10-CM

## 2015-06-01 DIAGNOSIS — B9689 Other specified bacterial agents as the cause of diseases classified elsewhere: Secondary | ICD-10-CM

## 2015-06-01 LAB — URINALYSIS, ROUTINE W REFLEX MICROSCOPIC
BILIRUBIN URINE: NEGATIVE
Glucose, UA: NEGATIVE mg/dL
KETONES UR: NEGATIVE mg/dL
LEUKOCYTES UA: NEGATIVE
Nitrite: NEGATIVE
Protein, ur: NEGATIVE mg/dL
Specific Gravity, Urine: 1.01 (ref 1.005–1.030)
pH: 6.5 (ref 5.0–8.0)

## 2015-06-01 LAB — URINE MICROSCOPIC-ADD ON

## 2015-06-01 LAB — WET PREP, GENITAL
Sperm: NONE SEEN
TRICH WET PREP: NONE SEEN
YEAST WET PREP: NONE SEEN

## 2015-06-01 LAB — CBC
HCT: 30.6 % — ABNORMAL LOW (ref 36.0–46.0)
Hemoglobin: 10.2 g/dL — ABNORMAL LOW (ref 12.0–15.0)
MCH: 26.7 pg (ref 26.0–34.0)
MCHC: 33.3 g/dL (ref 30.0–36.0)
MCV: 80.1 fL (ref 78.0–100.0)
Platelets: 328 10*3/uL (ref 150–400)
RBC: 3.82 MIL/uL — ABNORMAL LOW (ref 3.87–5.11)
RDW: 14.6 % (ref 11.5–15.5)
WBC: 11.4 10*3/uL — AB (ref 4.0–10.5)

## 2015-06-01 LAB — POCT PREGNANCY, URINE: Preg Test, Ur: POSITIVE — AB

## 2015-06-01 LAB — HCG, QUANTITATIVE, PREGNANCY: HCG, BETA CHAIN, QUANT, S: 10532 m[IU]/mL — AB (ref <5)

## 2015-06-01 LAB — ABO/RH: ABO/RH(D): O POS

## 2015-06-01 MED ORDER — CONCEPT OB 130-92.4-1 MG PO CAPS: 1.0000 | ORAL_CAPSULE | Freq: Every day | ORAL | Status: DC

## 2015-06-01 MED ORDER — METRONIDAZOLE 500 MG PO TABS: 500.0000 mg | ORAL_TABLET | Freq: Two times a day (BID) | ORAL | Status: DC

## 2015-06-01 NOTE — MAU Provider Note (Signed)
Chief Complaint: Abdominal Pain and Nausea   First Provider Initiated Contact with Patient 06/10/2015 at 06/01/15 at 0752 pm.   SUBJECTIVE HPI: Melody Salas is a 44 y.o. [id number] female who presents to [facility]aternity Admissions reporting spotting, cramping and nausea x 1 week. May be late for period. Patient's last menstrual period was 05/03/2015. Hasn't taken home UPT. UPT pos in MAU today  Location: suprapubic Quality: cramping Severity: 0/10 on pain scale now. 3/10 at worst Duration: 1 week Course: improving Context: None Timing: intermittent Modifying factors: None. Hasn't needed anything for the pain.  Associated signs and symptoms: Pos for spotting, nausea, constipation. Neg for fever, chills, vaginal discharge, vomiting, passage of tissue or clots, urinary complaints.   Past Medical History  Diagnosis Date  . Headache(784.0)   . Constipation    OB History  Gravida Para Term Preterm AB SAB TAB Ectopic Multiple Living  # Outcome Date GA Lbr Len/2nd Weight Sex Delivery Anes PTL Lv  5 Current           4 TAB           3 TAB           2 Term           1 Term              Past Surgical History  Procedure Laterality Date  . No past surgeries    . Vaginal delivery     Social History   Social History  . Marital Status: Single    Spouse Name: N/A  . Number of Children: N/A  . Years of Education: N/A   Occupational History  . Not on file.   Social History Main Topics  . Smoking status: Never Smoker   . Smokeless tobacco: Not on file  . Alcohol Use: No  . Drug Use: No  . Sexual Activity: Not Currently    Birth Control/ Protection: None     Comment: last sex about 5 wks aog   Other Topics Concern  . Not on file   Social History Narrative   No current facility-administered medications on file prior to encounter.   Current Outpatient Prescriptions on File Prior to Encounter  Medication Sig Dispense Refill  . antipyrine-benzocaine (AURALGAN)  otic solution Place 3-4 drops into the left ear every 2 (two) hours as needed for ear pain. 10 mL 0  . aspirin-acetaminophen-caffeine (EXCEDRIN MIGRAINE) 250-250-65 MG per tablet Take 2 tablets by mouth daily as needed (For headache.).    Marland Kitchen calcium carbonate (TUMS - DOSED IN MG ELEMENTAL CALCIUM) 500 MG chewable tablet Chew 2 tablets by mouth at bedtime as needed for heartburn.    . fluticasone (FLONASE) 50 MCG/ACT nasal spray Place 2 sprays into both nostrils daily. 16 g 2   No Known Allergies  I have reviewed the past Medical Hx, Surgical Hx, Social Hx, Allergies and Medications.   Review of Systems  Constitutional: Negative for fever, chills and appetite change.  Gastrointestinal: Positive for nausea, abdominal pain and constipation. Negative for vomiting and diarrhea.  Genitourinary: Positive for vaginal bleeding. Negative for vaginal discharge.  Musculoskeletal: Negative for back pain.    OBJECTIVE Patient Vitals for the past 24 hrs:  BP Temp Temp src Pulse Resp  06/01/15 1831 147/86 mmHg 98.6 F (37 C) Oral 94 18   Constitutional: Well-developed, well-nourished female in no acute distress.  Cardiovascular: normal rate Respiratory:  normal rate and effort.  GI: Abd soft, non-tender. Pos BS x 4 MS: Extremities nontender, no edema, normal ROM Neurologic: Alert and oriented x 4.  GU: Neg CVAT.  SPECULUM EXAM: NEFG, physiologic discharge, scant blood noted, cervix clean  BIMANUAL: cervix closed; uterus top-normal size, no adnexal tenderness or masses. No CMT.  LAB RESULTS Results for orders placed or performed during the hospital encounter of 06/01/15 (from the past 24 hour(s))  Urinalysis, Routine w reflex microscopic (not at [facility])     Status: Abnormal   Collection Time: 06/01/15  6:20 PM  Result Value Ref Range   Color, Urine YELLOW YELLOW   APPearance CLEAR CLEAR   Specific Gravity, Urine 1.010 1.005 - 1.030   pH 6.5 5.0 - 8.0   Glucose, UA NEGATIVE NEGATIVE mg/dL   Hgb  urine dipstick SMALL (A) NEGATIVE   Bilirubin Urine NEGATIVE NEGATIVE   Ketones, ur NEGATIVE NEGATIVE mg/dL   Protein, ur NEGATIVE NEGATIVE mg/dL   Nitrite NEGATIVE NEGATIVE   Leukocytes, UA NEGATIVE NEGATIVE  Urine microscopic-add on     Status: Abnormal   Collection Time: 06/01/15  6:20 PM  Result Value Ref Range   Squamous Epithelial / LPF 0-5 (A) NONE SEEN   WBC, UA 0-5 0 - 5 WBC/hpf   RBC / HPF 0-5 0 - 5 RBC/hpf   Bacteria, UA RARE (A) NONE SEEN  Pregnancy, urine POC     Status: Abnormal   Collection Time: 06/01/15  6:44 PM  Result Value Ref Range   Preg Test, Ur POSITIVE (A) NEGATIVE  hCG, quantitative, pregnancy     Status: Abnormal   Collection Time: 06/01/15  7:07 PM  Result Value Ref Range   hCG, Beta Chain, Quant, S 10532 (H) <5 mIU/mL  CBC     Status: Abnormal   Collection Time: 06/01/15  7:07 PM  Result Value Ref Range   WBC 11.4 (H) 4.0 - 10.5 K/uL   RBC 3.82 (L) 3.87 - 5.11 MIL/uL   Hemoglobin 10.2 (L) 12.0 - 15.0 g/dL   HCT 16.1 (L) 09.6 - 04.5 %   MCV 80.1 78.0 - 100.0 fL   MCH 26.7 26.0 - 34.0 pg   MCHC 33.3 30.0 - 36.0 g/dL   RDW 40.9 81.1 - 91.4 %   Platelets 328 150 - 400 K/uL  ABO/Rh     Status: None (Preliminary result)   Collection Time: 06/01/15  7:07 PM  Result Value Ref Range   ABO/RH(D) O POS   Wet prep, genital     Status: Abnormal   Collection Time: 06/01/15  8:00 PM  Result Value Ref Range   Yeast Wet Prep HPF POC NONE SEEN NONE SEEN   Trich, Wet Prep NONE SEEN NONE SEEN   Clue Cells Wet Prep HPF POC PRESENT (A) NONE SEEN   WBC, Wet Prep HPF POC FEW (A) NONE SEEN   Sperm NONE SEEN     IMAGING US Ob Comp Less 14 Wks  06/01/2015  CLINICAL DATA:  Vaginal bleeding and first-trimester pregnancy EXAM: OBSTETRIC <14 WK [place] AND TRANSVAGINAL OB US TECHNIQUE: Both transabdominal and transvaginal ultrasound examinations were performed for complete evaluation of the gestation as well as the maternal uterus, adnexal regions, and pelvic cul-de-sac.  Transvaginal technique was performed to assess early pregnancy. COMPARISON:  None. FINDINGS: Intrauterine gestational sac: Present Yolk sac:  Present Embryo:  Present Cardiac Activity: Present Heart Rate: 102  bpm CRL:  3.4  mm   6 w   0  d                  US EDC: 01/25/2016 Maternal uterus/adnexae: There is a heterogeneously hypoechoic mass the left uterine body measuring 3 cm with occasional shadowing. This is most consistent with a submucosal fibroid, which distorts the regional endometrium. A discrete subchorionic hematoma is not visualized. Physiologic appearance of the adnexa. IMPRESSION: 1. Single living intrauterine gestation measuring 6 weeks. 2. 3 cm submucosal fibroid. Electronically Signed   By: Marnee SpringJonathon  Watts M.D.   On: 06/01/2015 19:53   [place] Ob Transvaginal  06/01/2015  CLINICAL DATA:  Vaginal bleeding and first-trimester pregnancy EXAM: OBSTETRIC <14 WK US AND TRANSVAGINAL OB US TECHNIQUE: Both transabdominal and transvaginal ultrasound examinations were performed for complete evaluation of the gestation as well as the maternal uterus, adnexal regions, and pelvic cul-de-sac. Transvaginal technique was performed to assess early pregnancy. COMPARISON:  None. FINDINGS: Intrauterine gestational sac: Present Yolk sac:  Present Embryo:  Present Cardiac Activity: Present Heart Rate: 102  bpm CRL:  3.4  mm   6 w   0 d                  US EDC: 01/25/2016 Maternal uterus/adnexae: There is a heterogeneously hypoechoic mass the left uterine body measuring 3 cm with occasional shadowing. This is most consistent with a submucosal fibroid, which distorts the regional endometrium. A discrete subchorionic hematoma is not visualized. Physiologic appearance of the adnexa. IMPRESSION: 1. Single living intrauterine gestation measuring 6 weeks. 2. 3 cm submucosal fibroid. Electronically Signed   By: Marnee SpringJonathon  Watts M.D.   On: 06/01/2015 19:53    MAU COURSE CBC, CMP, ABO/Rh, ultrasound, wet prep and GC/chlamydia  culture, UA  MDM Pain and bleeding in early pregnancy with normal intrauterine pregnancy and hemodynamically stable.  ASSESSMENT 1. Threatened miscarriage in early pregnancy   2. Abdominal pain complicating pregnancy, antepartum   3. Vaginal bleeding in pregnancy, first trimester   4. BV (bacterial vaginosis)     PLAN Discharge home in stable condition. Bleeding precautions Rx Flagyl     Follow-up Information    Call [facility].   Why:  Start prenatal care   Contact information:   [address]  [address] [place] [address] [phone]       Follow up with [facility].   Why:  As needed in emergencies   Contact information:   [address] [id number] mc [place] [id number] [phone]       Medication List    STOP taking these medications        antipyrine-benzocaine otic solution  Commonly known as:  AURALGAN     aspirin-acetaminophen-caffeine [phone] MG tablet  Commonly known as:  EXCEDRIN MIGRAINE     fluticasone 50 MCG/ACT nasal spray  Commonly known as:  FLONASE      TAKE these medications        calcium carbonate 500 MG chewable tablet  Commonly known as:  TUMS - dosed in mg elemental calcium  Chew 2 tablets by mouth at bedtime as needed for heartburn.     CONCEPT OB 130-92.4-1 MG Caps  Take 1 tablet by mouth daily.     metroNIDAZOLE 500 MG tablet  Commonly known as:  FLAGYL  Take 1 tablet (500 mg total) by mouth 2 (two) times daily.         TusayanVirginia Karson Reede, CNM 06/01/2015  8:54 PM  4

## 2015-06-01 NOTE — MAU Note (Signed)
Pt presents complaining of lower abdominal cramping sometimes and nausea. States she thinks she may have missed her cycle. LMP 11/7. Has had pink discharge but denies now. Denies pain now.

## 2015-06-01 NOTE — Discharge Instructions (Signed)
Bacterial Vaginosis °Bacterial vaginosis is a vaginal infection that occurs when the normal balance of bacteria in the vagina is disrupted. It results from an overgrowth of certain bacteria. This is the most common vaginal infection in women of childbearing age. Treatment is important to prevent complications, especially in pregnant women, as it can cause a premature delivery. °CAUSES  °Bacterial vaginosis is caused by an increase in harmful bacteria that are normally present in smaller amounts in the vagina. Several different kinds of bacteria can cause bacterial vaginosis. However, the reason that the condition develops is not fully understood. °RISK FACTORS °Certain activities or behaviors can put you at an increased risk of developing bacterial vaginosis, including: °· Having a new sex partner or multiple sex partners. °· Douching. °· Using an intrauterine device (IUD) for contraception. °Women do not get bacterial vaginosis from toilet seats, bedding, swimming pools, or contact with objects around them. °SIGNS AND SYMPTOMS  °Some women with bacterial vaginosis have no signs or symptoms. Common symptoms include: °· Grey vaginal discharge. °· A fishlike odor with discharge, especially after sexual intercourse. °· Itching or burning of the vagina and vulva. °· Burning or pain with urination. °DIAGNOSIS  °Your health care provider will take a medical history and examine the vagina for signs of bacterial vaginosis. A sample of vaginal fluid may be taken. Your health care provider will look at this sample under a microscope to check for bacteria and abnormal cells. A vaginal pH test may also be done.  °TREATMENT  °Bacterial vaginosis may be treated with antibiotic medicines. These may be given in the form of a pill or a vaginal cream. A second round of antibiotics may be prescribed if the condition comes back after treatment. Because bacterial vaginosis increases your risk for sexually transmitted diseases, getting  treated can help reduce your risk for chlamydia, gonorrhea, HIV, and herpes. °HOME CARE INSTRUCTIONS  °· Only take over-the-counter or prescription medicines as directed by your health care provider. °· If antibiotic medicine was prescribed, take it as directed. Make sure you finish it even if you start to feel better. °· Tell all sexual partners that you have a vaginal infection. They should see their health care provider and be treated if they have problems, such as a mild rash or itching. °· During treatment, it is important that you follow these instructions: °¨ Avoid sexual activity or use condoms correctly. °¨ Do not douche. °¨ Avoid alcohol as directed by your health care provider. °¨ Avoid breastfeeding as directed by your health care provider. °SEEK MEDICAL CARE IF:  °· Your symptoms are not improving after 3 days of treatment. °· You have increased discharge or pain. °· You have a fever. °MAKE SURE YOU:  °· Understand these instructions. °· Will watch your condition. °· Will get help right away if you are not doing well or get worse. °FOR MORE INFORMATION  °Centers for Disease Control and Prevention, Division of STD Prevention: www.cdc.gov/std °American Sexual Health Association (ASHA): www.ashastd.org  °  °This information is not intended to replace advice given to you by your health care provider. Make sure you discuss any questions you have with your health care provider. °  °Document Released: 06/12/2005 Document Revised: 07/03/2014 Document Reviewed: 01/22/2013 °Elsevier Interactive Patient Education ©2016 Elsevier Inc. ° ° ° °Vaginal Bleeding During Pregnancy, First Trimester °A small amount of bleeding (spotting) from the vagina is relatively common in early pregnancy. It usually stops on its own. Various things may cause bleeding or spotting   pregnancy. Some bleeding may be related to the pregnancy, and some may not. In most cases, the bleeding is normal and is not a problem. However, bleeding  can also be a sign of something serious. Be sure to tell your health care provider about any vaginal bleeding right away. Some possible causes of vaginal bleeding during the first trimester include:  Infection or inflammation of the cervix.  Growths (polyps) on the cervix.  Miscarriage or threatened miscarriage.  Pregnancy tissue has developed outside of the uterus and in a fallopian tube (tubal pregnancy).  Tiny cysts have developed in the uterus instead of pregnancy tissue (molar pregnancy). HOME CARE INSTRUCTIONS  Watch your condition for any changes. The following actions may help to lessen any discomfort you are feeling:  Follow your health care provider's instructions for limiting your activity. If your health care provider orders bed rest, you may need to stay in bed and only get up to use the bathroom. However, your health care provider may allow you to continue light activity.  If needed, make plans for someone to help with your regular activities and responsibilities while you are on bed rest.  Keep track of the number of pads you use each day, how often you change pads, and how soaked (saturated) they are. Write this down.  Do not use tampons. Do not douche.  Do not have sexual intercourse or orgasms until approved by your health care provider.  If you pass any tissue from your vagina, save the tissue so you can show it to your health care provider.  Only take over-the-counter or prescription medicines as directed by your health care provider.  Do not take aspirin because it can make you bleed.  Keep all follow-up appointments as directed by your health care provider. SEEK MEDICAL CARE IF:  You have any vaginal bleeding during any part of your pregnancy.  You have cramps or labor pains.  You have a fever, not controlled by medicine. SEEK IMMEDIATE MEDICAL CARE IF:   You have severe cramps in your back or belly (abdomen).  You pass large clots or tissue from your  vagina.  Your bleeding increases.  You feel light-headed or weak, or you have fainting episodes.  You have chills.  You are leaking fluid or have a gush of fluid from your vagina.  You pass out while having a bowel movement. MAKE SURE YOU:  Understand these instructions.  Will watch your condition.  Will get help right away if you are not doing well or get worse.   This information is not intended to replace advice given to you by your health care provider. Make sure you discuss any questions you have with your health care provider.   Document Released: 03/22/2005 Document Revised: 06/17/2013 Document Reviewed: 02/17/2013 Elsevier Interactive Patient Education [employer]2016 Elsevier Inc.

## 2015-06-02 LAB — GC/CHLAMYDIA PROBE AMP (~~LOC~~) NOT AT ARMC
Chlamydia: NEGATIVE
NEISSERIA GONORRHEA: NEGATIVE

## 2015-06-02 LAB — HIV ANTIBODY (ROUTINE TESTING W REFLEX): HIV SCREEN 4TH GENERATION: NONREACTIVE

## 2015-06-08 ENCOUNTER — Inpatient Hospital Stay (HOSPITAL_COMMUNITY): Payer: 59

## 2015-06-08 ENCOUNTER — Inpatient Hospital Stay (HOSPITAL_COMMUNITY)
Admission: AD | Admit: 2015-06-08 | Discharge: 2015-06-08 | Disposition: A | Discharge status: Home / Self Care | Payer: 59 | Source: Ambulatory Visit | Attending: Obstetrics and Gynecology | Admitting: Obstetrics and Gynecology

## 2015-06-08 ENCOUNTER — Encounter (HOSPITAL_COMMUNITY): Payer: Self-pay

## 2015-06-08 DIAGNOSIS — O200 Threatened abortion: Secondary | ICD-10-CM

## 2015-06-08 DIAGNOSIS — O021 Missed abortion: Secondary | ICD-10-CM | POA: Insufficient documentation

## 2015-06-08 DIAGNOSIS — I10 Essential (primary) hypertension: Secondary | ICD-10-CM | POA: Diagnosis not present

## 2015-06-08 DIAGNOSIS — O4691 Antepartum hemorrhage, unspecified, first trimester: Secondary | ICD-10-CM | POA: Diagnosis present

## 2015-06-08 LAB — URINE MICROSCOPIC-ADD ON
BACTERIA UA: NONE SEEN
WBC UA: NONE SEEN WBC/hpf (ref 0–5)

## 2015-06-08 LAB — URINALYSIS, ROUTINE W REFLEX MICROSCOPIC
Bilirubin Urine: NEGATIVE
Glucose, UA: NEGATIVE mg/dL
Ketones, ur: NEGATIVE mg/dL
LEUKOCYTES UA: NEGATIVE
NITRITE: NEGATIVE
PROTEIN: NEGATIVE mg/dL
SPECIFIC GRAVITY, URINE: 1.02 (ref 1.005–1.030)
pH: 7 (ref 5.0–8.0)

## 2015-06-08 LAB — CBC
HEMATOCRIT: 32.1 % — AB (ref 36.0–46.0)
Hemoglobin: 10.5 g/dL — ABNORMAL LOW (ref 12.0–15.0)
MCH: 26.7 pg (ref 26.0–34.0)
MCHC: 32.7 g/dL (ref 30.0–36.0)
MCV: 81.7 fL (ref 78.0–100.0)
PLATELETS: 322 10*3/uL (ref 150–400)
RBC: 3.93 MIL/uL (ref 3.87–5.11)
RDW: 14.8 % (ref 11.5–15.5)
WBC: 9.5 10*3/uL (ref 4.0–10.5)

## 2015-06-08 MED ORDER — MISOPROSTOL 200 MCG PO TABS: ORAL_TABLET | ORAL | Status: DC

## 2015-06-08 MED ORDER — OXYCODONE-ACETAMINOPHEN 5-325 MG PO TABS: 1.0000 | ORAL_TABLET | Freq: Four times a day (QID) | ORAL | Status: DC | PRN

## 2015-06-08 MED ORDER — IBUPROFEN 600 MG PO TABS: 600.0000 mg | ORAL_TABLET | Freq: Four times a day (QID) | ORAL | Status: DC | PRN

## 2015-06-08 MED ORDER — PROMETHAZINE HCL 12.5 MG PO TABS: 12.5000 mg | ORAL_TABLET | Freq: Four times a day (QID) | ORAL | Status: DC | PRN

## 2015-06-08 NOTE — MAU Note (Signed)
Pt presents to MAU with complaints of vaginal bleeding on and off since Saturday. Denies any pain

## 2015-06-08 NOTE — MAU Provider Note (Signed)
History     CSN: [id number]  Arrival date and time: 06/08/15 1812   None     Chief Complaint  Patient presents with  . Vaginal Bleeding   HPI  H is a 44 year old G5 para 2022 at 6 weeks 6 days estimated gestational age who presents for bleeding. Patient had come to MAU on December 6 and noted to have a 6 week IUP. The bleeding had stopped after that visit. She had sexual intercourse and the bleeding came back after that and it has continued. Today she bled more at the office and had cramping. She noticed one clot on her pad. Patient did not notice any tissue. Patient denies any associated symptoms. Patient is not taking anything to make bleeding better or worse. Patient is Rh+ and noted in her chart.  Past Medical History  Diagnosis Date  . Headache(784.0)   . Constipation     Past Surgical History  Procedure Laterality Date  . No past surgeries    . Vaginal delivery      Family History  Problem Relation Age of Onset  . Other Neg Hx   . Hypertension Mother   . Cancer Maternal Grandmother     Social History  Substance Use Topics  . Smoking status: Never Smoker   . Smokeless tobacco: None  . Alcohol Use: No    Allergies: No Known Allergies  Prescriptions prior to admission  Medication Sig Dispense Refill Last Dose  . calcium carbonate (TUMS - DOSED IN MG ELEMENTAL CALCIUM) 500 MG chewable tablet Chew 2 tablets by mouth at bedtime as needed for heartburn.   Unknown at Unknown time  . metroNIDAZOLE (FLAGYL) 500 MG tablet Take 1 tablet (500 mg total) by mouth 2 (two) times daily. 14 tablet 0   . Prenat w/o A Vit-FeFum-FePo-FA (CONCEPT OB) 130-92.4-1 MG CAPS Take 1 tablet by mouth daily. 30 capsule 12     Review of Systems  Constitutional: Negative.   Respiratory: Negative.   Cardiovascular: Negative.   Gastrointestinal: Negative.   Genitourinary:       + vaginal bleeding, +cramping  Psychiatric/Behavioral: Negative.    Physical Exam   Blood pressure 150/81,  pulse 79, resp. rate 18, last menstrual period 05/03/2015.  Physical Exam  Vitals reviewed. Constitutional: She is oriented to person, place, and time. She appears well-developed and well-nourished. No distress.  HENT:  Head: Normocephalic and atraumatic.  Eyes: Conjunctivae are normal.  Cardiovascular: Normal rate.   Respiratory: Effort normal.  GI: Soft. She exhibits no distension and no mass. There is no tenderness. There is no rebound and no guarding.  Musculoskeletal: She exhibits no edema.  Neurological: She is alert and oriented to person, place, and time.  Skin: Skin is warm and dry.  Psychiatric: She has a normal mood and affect.   Results for orders placed or performed during the hospital encounter of 06/08/15 (from the past 24 hour(s))  Urinalysis, Routine w reflex microscopic (not at [facility])     Status: Abnormal   Collection Time: 06/08/15  6:30 PM  Result Value Ref Range   Color, Urine YELLOW YELLOW   APPearance CLEAR CLEAR   Specific Gravity, Urine 1.020 1.005 - 1.030   pH 7.0 5.0 - 8.0   Glucose, UA NEGATIVE NEGATIVE mg/dL   Hgb urine dipstick LARGE (A) NEGATIVE   Bilirubin Urine NEGATIVE NEGATIVE   Ketones, ur NEGATIVE NEGATIVE mg/dL   Protein, ur NEGATIVE NEGATIVE mg/dL   Nitrite NEGATIVE NEGATIVE   Leukocytes, UA NEGATIVE  NEGATIVE  Urine microscopic-add on     Status: Abnormal   Collection Time: 06/08/15  6:30 PM  Result Value Ref Range   Squamous Epithelial / LPF 0-5 (A) NONE SEEN   WBC, UA NONE SEEN 0 - 5 WBC/hpf   RBC / HPF 0-5 0 - 5 RBC/hpf   Bacteria, UA NONE SEEN NONE SEEN  CBC     Status: Abnormal   Collection Time: 06/08/15  8:30 PM  Result Value Ref Range   WBC 9.5 4.0 - 10.5 K/uL   RBC 3.93 3.87 - 5.11 MIL/uL   Hemoglobin 10.5 (L) 12.0 - 15.0 g/dL   HCT 40.9 (L) 81.1 - 91.4 %   MCV 81.7 78.0 - 100.0 fL   MCH 26.7 26.0 - 34.0 pg   MCHC 32.7 30.0 - 36.0 g/dL   RDW 78.2 95.6 - 21.3 %   Platelets 322 150 - 400 K/uL   US Ob  Transvaginal  06/08/2015  ADDENDUM REPORT: 06/08/2015 20:16 ADDENDUM: The clinical service requested clarification regarding the finding of absence of cardiac activity. Per consensus criteria, absence of cardiac activity at a crown lump length of greater than or equal to 7 mm is consistent with nonviable pregnancy. Absence of cardiac activity at a size of 3 mm is technically indeterminate, but given presence on the prior exam, most consistent with nonviable pregnancy. This recommendation follows SRU consensus guidelines: Diagnostic Criteria for Nonviable Pregnancy Early in the First Trimester. Malva Limes Med 2013; 086:5784-69. Electronically Signed   By: Jeronimo Greaves M.D.   On: 06/08/2015 20:16  06/08/2015  CLINICAL DATA:  Threatened miscarriage. Early pregnancy. Intermittent bleeding since Saturday. Cramping. EXAM: TRANSVAGINAL OB ULTRASOUND TECHNIQUE: Transvaginal ultrasound was performed for complete evaluation of the gestation as well as the maternal uterus, adnexal regions, and pelvic cul-de-sac. COMPARISON:  06/01/2015 FINDINGS: Intrauterine gestational sac: Visualized/normal in shape. Yolk sac:  Likely visualized Embryo:  Present Cardiac Activity: Absent CRL:   3  mm   5 w 6 d                  [place] EDC: 02/02/2016. Maternal uterus/adnexae: Multiple uterine fibroids again identified. The largest is in the left side of the uterine body and measures 3 cm. Suspect a right ovarian corpus luteal cyst. IMPRESSION: 1. Visualized 3 mm fetal pole, without evidence of cardiac activity. The absence of growth since 06/01/2015 and absence of previously visualized cardiac activity are suspicious for a nonviable pregnancy. 2. Probable right ovarian corpus luteal cyst. 3. Uterine fibroids. These results will be called to the ordering clinician or representative by the Radiologist Assistant, and communication documented in the PACS or zVision Dashboard. Electronically Signed: By: Jeronimo Greaves M.D. On: 06/08/2015 19:55     MAU Course  Procedures  MDM Bedside ultrasound performed. Bladder is empty and transabdominal approach is not adequate to verify IUP. We'll proceed with transvaginal ultrasound in the radiology department  Hypertension. RN to retake blood pressure Dr Penne Lash discussed Cytotec option with patient. Patient would like Cytotec today. CBC ordered.   Early Intrauterine Pregnancy Failure  _x__  Documented intrauterine pregnancy failure less than or equal to [redacted] weeks gestation  _x__  No serious current illness  _x__  Baseline Hgb greater than or equal to 10g/dl  _x__  Patient has easily accessible transportation to the hospital  _x__  Clear preference  _x__  Practitioner/physician deems patient reliable  _x__  Counseling by practitioner or physician  _x__  Patient education by RN  _N/A__  Consent form signed  _N/A__  Rho-Gam given by RN if indicated  _x__ Medication dispensed   _x__   Cytotec 800 mcg  _x_   Intravaginally by patient at home         __   Intravaginally by RN in MAU        __   Rectally by patient at home        __   Rectally by RN in MAU  __x_  Ibuprofen 600 mg 1 tablet by mouth every 6 hours as needed #30  _x__  Oxycodone/Acetaminophen 5/325 mg by mouth every 4 to 6 hours as needed  _x__  Phenergan 12.5 mg by mouth every 4 hours as needed for nausea    Assessment and Plan  A: Missed AB  P: Discharge home Rx for Cytotec, Phenergan, Ibuprofen and Percocet given to patient Bleeding precautions discussed Patient advised to follow-up with [facility] in 1-2 weeks or sooner PRN Patient may return to MAU as needed or if her condition were to change or worsen  Marny LowensteinJulie N Gustavus Haskin, PA-C  06/08/2015, 8:45 PM

## 2015-06-08 NOTE — Discharge Instructions (Signed)
Incomplete Miscarriage A miscarriage is the sudden loss of an unborn baby (fetus) before the 20th week of pregnancy. In an incomplete miscarriage, parts of the fetus or placenta (afterbirth) remain in the body.  Having a miscarriage can be an emotional experience. Talk with your health care provider about any questions you may have about miscarrying, the grieving process, and your future pregnancy plans. CAUSES   Problems with the fetal chromosomes that make it impossible for the baby to develop normally. Problems with the baby's genes or chromosomes are most often the result of errors that occur by chance as the embryo divides and grows. The problems are not inherited from the parents.  Infection of the cervix or uterus.  Hormone problems.  Problems with the cervix, such as having an incompetent cervix. This is when the tissue in the cervix is not strong enough to hold the pregnancy.  Problems with the uterus, such as an abnormally shaped uterus, uterine fibroids, or congenital abnormalities.  Certain medical conditions.  Smoking, drinking alcohol, or taking illegal drugs.  Trauma. SYMPTOMS   Vaginal bleeding or spotting, with or without cramps or pain.  Pain or cramping in the abdomen or lower back.  Passing fluid, tissue, or blood clots from the vagina. DIAGNOSIS  Your health care provider will perform a physical exam. You may also have an ultrasound to confirm the miscarriage. Blood or urine tests may also be ordered. TREATMENT   Usually, a dilation and curettage (D&C) procedure is performed. During a D&C procedure, the cervix is widened (dilated) and any remaining fetal or placental tissue is gently removed from the uterus.  Antibiotic medicines are prescribed if there is an infection. Other medicines may be given to reduce the size of the uterus (contract) if there is a lot of bleeding.  If you have Rh negative blood and your baby was Rh positive, you will need a Rho (D)  immune globulin shot. This shot will protect any future baby from having Rh blood problems in future pregnancies.  You may be confined to bed rest. This means you should stay in bed and only get up to use the bathroom. HOME CARE INSTRUCTIONS   Rest as directed by your health care provider.  Restrict activity as directed by your health care provider. You may be allowed to continue light activity if curettage was not done but you require further treatment.  Keep track of the number of pads you use each day. Keep track of how soaked (saturated) they are. Record this information.  Do not  use tampons.  Do not douche or have sexual intercourse until approved by your health care provider.  Keep all follow-up appointments for reevaluation and continuing management.  Only take over-the-counter or prescription medicines for pain, fever, or discomfort as directed by your health care provider.  Take antibiotic medicine as directed by your health care provider. Make sure you finish it even if you start to feel better. SEEK IMMEDIATE MEDICAL CARE IF:   You experience severe cramps in your stomach, back, or abdomen.  You have an unexplained temperature (make sure to record these temperatures).  You pass large clots or tissue (save these for your health care provider to inspect).  Your bleeding increases.  You become light-headed, weak, or have fainting episodes. MAKE SURE YOU:   Understand these instructions.  Will watch your condition.  Will get help right away if you are not doing well or get worse.   This information is not intended to   replace advice given to you by your health care provider. Make sure you discuss any questions you have with your health care provider.   Document Released: 06/12/2005 Document Revised: 07/03/2014 Document Reviewed: 01/09/2013 Elsevier Interactive Patient Education 2016 Elsevier Inc.  

## 2016-02-28 ENCOUNTER — Emergency Department (HOSPITAL_COMMUNITY)
Admission: EM | Admit: 2016-02-28 | Discharge: 2016-02-28 | Disposition: A | Discharge status: Home / Self Care | Payer: Self-pay | Attending: Emergency Medicine | Admitting: Emergency Medicine

## 2016-02-28 ENCOUNTER — Emergency Department (HOSPITAL_COMMUNITY): Payer: Self-pay

## 2016-02-28 ENCOUNTER — Encounter (HOSPITAL_COMMUNITY): Payer: Self-pay | Admitting: Emergency Medicine

## 2016-02-28 DIAGNOSIS — M25572 Pain in left ankle and joints of left foot: Secondary | ICD-10-CM | POA: Insufficient documentation

## 2016-02-28 DIAGNOSIS — M79672 Pain in left foot: Secondary | ICD-10-CM

## 2016-02-28 MED ORDER — NAPROXEN 500 MG PO TABS: 500.0000 mg | ORAL_TABLET | Freq: Two times a day (BID) | ORAL | 0 refills | Status: DC

## 2016-02-28 MED ORDER — NAPROXEN 250 MG PO TABS
500.0000 mg | ORAL_TABLET | Freq: Once | ORAL | Status: AC
  Administered 2016-02-28: 500 mg via ORAL
  Filled 2016-02-28: qty 2

## 2016-02-28 NOTE — ED Provider Notes (Signed)
[facility]-EMERGENCY DEPT Provider Note   CSN: [id number] Arrival date & time: 02/28/16  1137    By signing my name below, I, Sonum Patel, attest that this documentation has been prepared under the direction and in the presence of Diantha Paxson, [place]. Electronically Signed: Sonum Patel, Neurosurgeoncribe. 02/28/16. 12:14 PM.  History   Chief Complaint Chief Complaint  Patient presents with  . Foot Pain    The history is provided by the patient. No language interpreter was used.    HPI Comments: Melody Salas is a 45 y.o. female who presents to the Emergency Department complaining of constant left heel pain that has been ongoing for the past 3 weeks. She denies known injuries or trauma to the affected area. She denies similar symptoms in the past. She has not tried any OTC medication or home remedies. She rates her pain as 5-6/10 currently, but is 10/10 upon waking in the morning. She states the pain is worst upon waking and eases throughout the day. She notes wearing flat shoes without arch support and is concerned this is related. She denies numbness, weakness.    Past Medical History:  Diagnosis Date  . Constipation   . Headache(784.0)     There are no active problems to display for this patient.   Past Surgical History:  Procedure Laterality Date  . NO PAST SURGERIES    . VAGINAL DELIVERY      OB History    Gravida Para Term Preterm AB Living   5 2 2   2 2    SAB TAB Ectopic Multiple Live Births     2             Home Medications    Prior to Admission medications   Medication Sig Start Date End Date Taking? Authorizing Provider  calcium carbonate (TUMS - DOSED IN MG ELEMENTAL CALCIUM) 500 MG chewable tablet Chew 2 tablets by mouth at bedtime as needed for heartburn.    Historical Provider, MD  ibuprofen (ADVIL,MOTRIN) 600 MG tablet Take 1 tablet (600 mg total) by mouth every 6 (six) hours as needed. 06/08/15   Marny LowensteinJulie N Wenzel, PA-C  metroNIDAZOLE (FLAGYL) 500 MG tablet Take 1  tablet (500 mg total) by mouth 2 (two) times daily. 06/01/15   Dorathy KinsmanVirginia Smith, CNM  misoprostol (CYTOTEC) 200 MCG tablet Place 4 tabs (800 mcg) in the vagina once 06/08/15   Marny LowensteinJulie N Wenzel, PA-C  oxyCODONE-acetaminophen (PERCOCET/ROXICET) 5-325 MG tablet Take 1-2 tablets by mouth every 6 (six) hours as needed for severe pain. 06/08/15   Marny LowensteinJulie N Wenzel, PA-C  Prenat w/o A Vit-FeFum-FePo-FA (CONCEPT OB) 130-92.4-1 MG CAPS Take 1 tablet by mouth daily. 06/01/15   Dorathy KinsmanVirginia Smith, CNM  promethazine (PHENERGAN) 12.5 MG tablet Take 1 tablet (12.5 mg total) by mouth every 6 (six) hours as needed for nausea or vomiting. 06/08/15   Marny LowensteinJulie N Wenzel, PA-C    Family History Family History  Problem Relation Age of Onset  . Other Neg Hx   . Hypertension Mother   . Cancer Maternal Grandmother     Social History Social History  Substance Use Topics  . Smoking status: Never Smoker  . Smokeless tobacco: Not on file  . Alcohol use No     Allergies   Review of patient's allergies indicates no known allergies.   Review of Systems Review of Systems  10 Systems reviewed and all are negative for acute change except as noted in the HPI.   Physical Exam Updated Vital  Signs BP 139/79 (BP Location: Right Arm)   Pulse 80   Temp 98.1 F (36.7 C) (Oral)   Resp 18   LMP 02/28/2016   SpO2 100%   Breastfeeding? Unknown   Physical Exam  Constitutional: She is oriented to person, place, and time. She appears well-developed and well-nourished.  HENT:  Head: Normocephalic and atraumatic.  Cardiovascular: Normal rate and intact distal pulses.   Pulmonary/Chest: Effort normal.  Musculoskeletal: Normal range of motion. She exhibits tenderness. She exhibits no edema or deformity.  Left lateral posterior heel tenderness. No other foot tenderness. Full ROM of ankle with good strength. 2+ DP/PT pulses.   Neurological: She is alert and oriented to person, place, and time.  Skin: Skin is warm and dry.    Psychiatric: She has a normal mood and affect.  Nursing note and vitals reviewed.    ED Treatments / Results  DIAGNOSTIC STUDIES: Oxygen Saturation is 100% on RA, normal by my interpretation.    COORDINATION OF CARE: 12:16 PM Discussed treatment plan with pt at bedside and pt agreed to plan.    Labs (all labs ordered are listed, but only abnormal results are displayed) Labs Reviewed - No data to display  EKG  EKG Interpretation None       Radiology Dg Foot Complete Left  Result Date: 02/28/2016 CLINICAL DATA:  All around left heel pain for several weeks now - pain is very prominent when standing for first time in morning - no known injury EXAM: LEFT FOOT - COMPLETE 3+ VIEW COMPARISON:  None. FINDINGS: There is no evidence of fracture or dislocation. Small plantar spur is present. Soft tissues are unremarkable. IMPRESSION: No evidence for acute  abnormality. Electronically Signed   By: Norva Pavlov M.D.   On: 02/28/2016 12:55    Procedures Procedures (including critical care time)  Medications Ordered in ED Medications - No data to display   Initial Impression / Assessment and Plan / ED Course  I have reviewed the triage vital signs and the nursing notes.  Pertinent labs & imaging results that were available during my care of the patient were reviewed by me and considered in my medical decision making (see chart for details).  Clinical Course   Heel spur present on XR . Discussed supportive therapies. Instructed f/u with ortho as needed. ER return precautions given.  Final Clinical Impressions(s) / ED Diagnoses   Final diagnoses:  Heel pain, left    New Prescriptions Discharge Medication List as of 02/28/2016  1:11 PM    START taking these medications   Details  naproxen (NAPROSYN) 500 MG tablet Take 1 tablet (500 mg total) by mouth 2 (two) times daily., Starting Mon 02/28/2016, Print        I personally performed the services described in this  documentation, which was scribed in my presence. The recorded information has been reviewed and is accurate.     Carlene Coria, PA-C 02/29/16 0901    Lorre Nick, MD 02/29/16 [phone]

## 2016-02-28 NOTE — ED Notes (Signed)
Declined W/C at D/C and was escorted to lobby by RN. 

## 2016-02-28 NOTE — ED Triage Notes (Signed)
Pt sts left heel pain x 1 month worse upon waking in am and when walking

## 2016-08-07 ENCOUNTER — Inpatient Hospital Stay (HOSPITAL_COMMUNITY)
Admission: AD | Admit: 2016-08-07 | Discharge: 2016-08-07 | Disposition: A | Discharge status: Home / Self Care | Payer: BLUE CROSS/BLUE SHIELD | Source: Ambulatory Visit | Attending: Obstetrics and Gynecology | Admitting: Obstetrics and Gynecology

## 2016-08-07 ENCOUNTER — Encounter (HOSPITAL_COMMUNITY): Payer: Self-pay | Admitting: *Deleted

## 2016-08-07 DIAGNOSIS — N760 Acute vaginitis: Secondary | ICD-10-CM

## 2016-08-07 DIAGNOSIS — B9689 Other specified bacterial agents as the cause of diseases classified elsewhere: Secondary | ICD-10-CM | POA: Insufficient documentation

## 2016-08-07 DIAGNOSIS — L243 Irritant contact dermatitis due to cosmetics: Secondary | ICD-10-CM | POA: Insufficient documentation

## 2016-08-07 DIAGNOSIS — Z79899 Other long term (current) drug therapy: Secondary | ICD-10-CM | POA: Diagnosis not present

## 2016-08-07 DIAGNOSIS — N898 Other specified noninflammatory disorders of vagina: Secondary | ICD-10-CM | POA: Diagnosis present

## 2016-08-07 LAB — URINALYSIS, ROUTINE W REFLEX MICROSCOPIC
Bacteria, UA: NONE SEEN
Bilirubin Urine: NEGATIVE
GLUCOSE, UA: NEGATIVE mg/dL
Ketones, ur: NEGATIVE mg/dL
Leukocytes, UA: NEGATIVE
Nitrite: NEGATIVE
PH: 6 (ref 5.0–8.0)
Protein, ur: NEGATIVE mg/dL
Specific Gravity, Urine: 1.013 (ref 1.005–1.030)

## 2016-08-07 LAB — WET PREP, GENITAL
CLUE CELLS WET PREP: NONE SEEN
SPERM: NONE SEEN
TRICH WET PREP: NONE SEEN
Yeast Wet Prep HPF POC: NONE SEEN

## 2016-08-07 LAB — POCT PREGNANCY, URINE: Preg Test, Ur: NEGATIVE

## 2016-08-07 NOTE — MAU Note (Signed)
Having an itching and a little d/c with an odor.  Has some vaginal irritation.

## 2016-08-07 NOTE — MAU Provider Note (Signed)
Patient Melody Salas is a 46 year old non-pregnant female here with complaints of vaginal itching and odor for the past two days. She has recently changed her soaps and things that this could be causing her discomfort. She is using the Nexplanon for birth control  History     CSN: [id number]  Arrival date and time: 08/07/16 1807    Chief Complaint  Patient presents with  . Vaginal Discharge  . vag irritation   Vaginal Discharge  The patient's primary symptoms include genital itching, a genital odor and vaginal discharge. The patient's pertinent negatives include no genital lesions, genital rash, missed menses, pelvic pain or vaginal bleeding. This is a new problem. The current episode started in the past 7 days. The problem occurs intermittently. The problem has been unchanged. Pertinent negatives include no abdominal pain, anorexia, back pain, chills, constipation, diarrhea, discolored urine, dysuria, fever, flank pain, frequency, headaches, hematuria, joint pain, joint swelling, nausea, painful intercourse, rash, sore throat, urgency or vomiting. The vaginal discharge was normal. There has been no bleeding. She has not been passing clots. She has not been passing tissue. Nothing aggravates the symptoms. She has tried nothing for the symptoms.    OB History    Gravida Para Term Preterm AB Living   5 2 2   2 2    SAB TAB Ectopic Multiple Live Births     2            Past Medical History:  Diagnosis Date  . Constipation   . MWNUUVOZ(366.4Headache(784.0)     Past Surgical History:  Procedure Laterality Date  . NO PAST SURGERIES      Family History  Problem Relation Age of Onset  . Cancer Maternal Grandmother   . Hypertension Mother   . Other Neg Hx     Social History  Substance Use Topics  . Smoking status: Never Smoker  . Smokeless tobacco: Never Used  . Alcohol use No    Allergies: No Known Allergies  Prescriptions Prior to Admission  Medication Sig Dispense Refill Last Dose  .  ferrous sulfate 325 (65 FE) MG tablet Take 325 mg by mouth daily with breakfast.   08/07/2016 at Unknown time    Review of Systems  Constitutional: Negative for chills and fever.  HENT: Negative for sore throat.   Eyes: Negative.   Respiratory: Negative.   Cardiovascular: Negative.   Gastrointestinal: Negative for abdominal pain, anorexia, constipation, diarrhea, nausea and vomiting.  Genitourinary: Positive for vaginal discharge. Negative for dysuria, flank pain, frequency, hematuria, missed menses, pelvic pain and urgency.  Musculoskeletal: Negative for back pain and joint pain.  Skin: Negative for rash.  Neurological: Negative for headaches.   Physical Exam   Blood pressure 129/81, pulse 81, temperature 99.1 F (37.3 C), temperature source Oral, resp. rate 16, weight 184 lb (83.5 kg), last menstrual period 07/27/2016, SpO2 100 %, unknown if currently breastfeeding.  Physical Exam  Constitutional: She is oriented to person, place, and time. She appears well-developed and well-nourished.  HENT:  Head: Normocephalic.  Neck: Normal range of motion.  Respiratory: Effort normal.  GI: Soft. Bowel sounds are normal.  Genitourinary:  Genitourinary Comments: NEFG; no lesions on vaginal walls. Yellow colored- ischarge with odor present in the vagina; non-adherent.  No CMT, no lesions visualized on cervix. No adnexal or suprapubic tenderness.   Musculoskeletal: Normal range of motion.  Neurological: She is alert and oriented to person, place, and time.  Skin: Skin is warm and dry.  Results for orders placed or performed during the hospital encounter of 08/07/16 (from the past 24 hour(s))  Urinalysis, Routine w reflex microscopic     Status: Abnormal   Collection Time: 08/07/16  4:30 PM  Result Value Ref Range   Color, Urine YELLOW YELLOW   APPearance CLEAR CLEAR   Specific Gravity, Urine 1.013 1.005 - 1.030   pH 6.0 5.0 - 8.0   Glucose, UA NEGATIVE NEGATIVE mg/dL   Hgb urine dipstick  SMALL (A) NEGATIVE   Bilirubin Urine NEGATIVE NEGATIVE   Ketones, ur NEGATIVE NEGATIVE mg/dL   Protein, ur NEGATIVE NEGATIVE mg/dL   Nitrite NEGATIVE NEGATIVE   Leukocytes, UA NEGATIVE NEGATIVE   RBC / HPF 0-5 0 - 5 RBC/hpf   WBC, UA 0-5 0 - 5 WBC/hpf   Bacteria, UA NONE SEEN NONE SEEN   Squamous Epithelial / LPF 0-5 (A) NONE SEEN   Mucous PRESENT   Pregnancy, urine POC     Status: None   Collection Time: 08/07/16  6:35 PM  Result Value Ref Range   Preg Test, Ur NEGATIVE NEGATIVE  Wet prep, genital     Status: Abnormal   Collection Time: 08/07/16  7:57 PM  Result Value Ref Range   Yeast Wet Prep HPF POC NONE SEEN NONE SEEN   Trich, Wet Prep NONE SEEN NONE SEEN   Clue Cells Wet Prep HPF POC NONE SEEN NONE SEEN   WBC, Wet Prep HPF POC FEW (A) NONE SEEN   Sperm NONE SEEN     MAU Course  Procedures  MDM -wet prep pending -GC CT pending -UA no signs of dehydration  Plan of care discussed with Dr. Mindi Slicker, who agrees.   Dorathy Kinsman, CNM assumed care of pt at 2015. Labs pending.  New vaginal irritation coinciding w/ change in soap and w/ neg wet prep suggests allergic vaginitis.   Assessment and Plan   1. Allergic vaginitis   2. Irritant contact dermatitis due to cosmetics    D/C home in stable condition. D/C soap Baking soda soaks PRN GC/Chlmaydia pending Follow-up Information     [facility] Follow up.   Why:  as needed if no improvement in 1 week Contact information: [address]  [address] [place] [address] [phone]          Allergies as of 08/07/2016   No Known Allergies     Medication List    TAKE these medications   ferrous sulfate 325 (65 FE) MG tablet Take 325 mg by mouth daily with breakfast.       Charlesetta Garibaldi Kooistra CNM 08/07/2016, 8:02 PM   Dorathy Kinsman, CNM 08/07/2016 9:26 PM

## 2016-08-07 NOTE — Discharge Instructions (Signed)
Stop using the new soap that you switched to. Avoid using soap close to your vagina. Use soaps that are hypoallergenic and do not have perfumes or dyes. Soaking in a tub w/ 1/2 cup baking soda may help decrease irritation.   Vaginitis Vaginitis is an inflammation of the vagina. It is most often caused by a change in the normal balance of the bacteria and yeast that live in the vagina. This change in balance causes an overgrowth of certain bacteria or yeast, which causes the inflammation. There are different types of vaginitis, but the most common types are:  Bacterial vaginosis.  Yeast infection (candidiasis).  Trichomoniasis vaginitis. This is a sexually transmitted infection (STI).  Viral vaginitis.  Atrophic vaginitis.  Allergic vaginitis. CAUSES  The cause depends on the type of vaginitis. Vaginitis can be caused by:  Bacteria (bacterial vaginosis).  Yeast (yeast infection).  A parasite (trichomoniasis vaginitis)  A virus (viral vaginitis).  Low hormone levels (atrophic vaginitis). Low hormone levels can occur during pregnancy, breastfeeding, or after menopause.  Irritants, such as bubble baths, scented tampons, and feminine sprays (allergic vaginitis). Other factors can change the normal balance of the yeast and bacteria that live in the vagina. These include:  Antibiotic medicines.  Poor hygiene.  Diaphragms, vaginal sponges, spermicides, birth control pills, and intrauterine devices (IUD).  Sexual intercourse.  Infection.  Uncontrolled diabetes.  A weakened immune system. SYMPTOMS  Symptoms can vary depending on the cause of the vaginitis. Common symptoms include:  Abnormal vaginal discharge.  The discharge is white, gray, or yellow with bacterial vaginosis.  The discharge is thick, white, and cheesy with a yeast infection.  The discharge is frothy and yellow or greenish with trichomoniasis.  A bad vaginal odor.  The odor is fishy with bacterial  vaginosis.  Vaginal itching, pain, or swelling.  Painful intercourse.  Pain or burning when urinating. Sometimes, there are no symptoms. TREATMENT  Treatment will vary depending on the type of infection.   Bacterial vaginosis and trichomoniasis are often treated with antibiotic creams or pills.  Yeast infections are often treated with antifungal medicines, such as vaginal creams or suppositories.  Viral vaginitis has no cure, but symptoms can be treated with medicines that relieve discomfort. Your sexual partner should be treated as well.  Atrophic vaginitis may be treated with an estrogen cream, pill, suppository, or vaginal ring. If vaginal dryness occurs, lubricants and moisturizing creams may help. You may be told to avoid scented soaps, sprays, or douches.  Allergic vaginitis treatment involves quitting the use of the product that is causing the problem. Vaginal creams can be used to treat the symptoms. HOME CARE INSTRUCTIONS   Take all medicines as directed by your caregiver.  Keep your genital area clean and dry. Avoid soap and only rinse the area with water.  Avoid douching. It can remove the healthy bacteria in the vagina.  Do not use tampons or have sexual intercourse until your vaginitis has been treated. Use sanitary pads while you have vaginitis.  Wipe from front to back. This avoids the spread of bacteria from the rectum to the vagina.  Let air reach your genital area.  Wear cotton underwear to decrease moisture buildup.  Avoid wearing underwear while you sleep until your vaginitis is gone.  Avoid tight pants and underwear or nylons without a cotton panel.  Take off wet clothing (especially bathing suits) as soon as possible.  Use mild, non-scented products. Avoid using irritants, such as:  Scented feminine sprays.  Fabric softeners.  Scented detergents.  Scented tampons.  Scented soaps or bubble baths.  Practice safe sex and use condoms. Condoms  may prevent the spread of trichomoniasis and viral vaginitis. SEEK MEDICAL CARE IF:   You have abdominal pain.  You have a fever or persistent symptoms for more than 2-3 days.  You have a fever and your symptoms suddenly get worse. This information is not intended to replace advice given to you by your health care provider. Make sure you discuss any questions you have with your health care provider. Document Released: 04/09/2007 Document Revised: 10/27/2014 Document Reviewed: 11/23/2011 Elsevier Interactive Patient Education  2017 [employer].

## 2016-08-08 LAB — GC/CHLAMYDIA PROBE AMP (~~LOC~~) NOT AT ARMC
CHLAMYDIA, DNA PROBE: NEGATIVE
NEISSERIA GONORRHEA: NEGATIVE

## 2016-10-16 ENCOUNTER — Encounter (HOSPITAL_COMMUNITY): Payer: Self-pay | Admitting: Emergency Medicine

## 2016-10-16 ENCOUNTER — Ambulatory Visit (HOSPITAL_COMMUNITY)
Admission: EM | Admit: 2016-10-16 | Discharge: 2016-10-16 | Disposition: A | Discharge status: Home / Self Care | Payer: BLUE CROSS/BLUE SHIELD | Attending: Internal Medicine | Admitting: Internal Medicine

## 2016-10-16 DIAGNOSIS — G44099 Other trigeminal autonomic cephalgias (TAC), not intractable: Secondary | ICD-10-CM | POA: Diagnosis not present

## 2016-10-16 NOTE — ED Provider Notes (Signed)
CSN: [id number]     Arrival date & time 10/16/16  1435 History   First MD Initiated Contact with Patient 10/16/16 1615     Chief Complaint  Patient presents with  . Headache  . Eye Pain   (Consider location/radiation/quality/duration/timing/severity/associated sxs/prior Treatment) 46 year old female states that she has been having right temporal headache off and on for a few months. Generally it is relatively mild to moderate and often abates without intervention. Sometime she can take anti-inflammatory medicine and that will also abate the headache. She was concerned about this particular type of headache this weekend when she awoke Saturday, 2 days ago, when she had more pain in the right temple in a straight line toward the lateral right orbital rim. Denies problems with vision, speech, hearing, swallowing. She states the pain is more of a constant headache. Not so much throbbing but more sharp. There were no other associated symptoms. No migrainous symptoms. . She also denies problems with memory, cognition or judgment.She occasionally takes NSAIDs which have helped such as the [facility] powder but does not last very long. No history of migrainous-type headaches. She does note a history in the family of aneurysms.      Past Medical History:  Diagnosis Date  . Constipation   . WGNFAOZH(086.5)    Past Surgical History:  Procedure Laterality Date  . NO PAST SURGERIES     Family History  Problem Relation Age of Onset  . Cancer Maternal Grandmother   . Hypertension Mother   . Other Neg Hx    Social History  Substance Use Topics  . Smoking status: Never Smoker  . Smokeless tobacco: Never Used  . Alcohol use No   OB History    Gravida Para Term Preterm AB Living   SAB TAB Ectopic Multiple Live Births     2           Review of Systems  Constitutional: Negative for activity change, fatigue and fever.  HENT: Positive for dental problem. Negative for ear discharge, ear  pain, facial swelling, postnasal drip, rhinorrhea, sinus pressure and sore throat.   Eyes: Negative.   Respiratory: Negative.   Gastrointestinal: Negative.   Genitourinary: Negative.   Musculoskeletal: Negative.   Neurological: Positive for headaches. Negative for seizures, syncope, facial asymmetry, speech difficulty, weakness, light-headedness and numbness.       Denies current headache and actually states that it has been getting better over the past several hours and today.  Psychiatric/Behavioral: Negative.     Allergies  Patient has no known allergies.  Home Medications   Prior to Admission medications   Medication Sig Start Date End Date Taking? Authorizing Provider  ferrous sulfate 325 (65 FE) MG tablet Take 325 mg by mouth daily with breakfast.    Historical Provider, MD   Meds Ordered and Administered this Visit  Medications - No data to display  BP 131/80 (BP Location: Left Arm)   Pulse 78   Temp 98.6 F (37 C) (Oral)   Ht  (1.702 m)   Wt 180 lb (81.6 kg)   LMP 09/26/2016   SpO2 100%   BMI 28.19 kg/m  No data found.   Physical Exam  Constitutional: She is oriented to person, place, and time. She appears well-developed and well-nourished. No distress.  HENT:  Head: Normocephalic and atraumatic.  Right Ear: External ear normal.  Left Ear: External ear normal.  Mouth/Throat: Oropharynx is clear and moist.  No oropharyngeal exudate.  Bilateral TMs are normal. Oropharynx is clear. Tongue midline, uvula midline. Soft palate rises symmetrically. There is no tenderness to the temple or parietal scalp today. No headache at this time.  Eyes: Conjunctivae and EOM are normal. Pupils are equal, round, and reactive to light. Right eye exhibits no discharge. Left eye exhibits no discharge.  Conjugate gaze.  Neck: Normal range of motion. Neck supple.  Cardiovascular: Normal rate, regular rhythm, normal heart sounds and intact distal pulses.   Pulmonary/Chest: Effort  normal and breath sounds normal. No respiratory distress.  Abdominal: Soft. There is no tenderness.  Musculoskeletal: Normal range of motion. She exhibits no edema or tenderness.  Lymphadenopathy:    She has no cervical adenopathy.  Neurological: She is alert and oriented to person, place, and time. She has normal strength. She displays no tremor. No cranial nerve deficit or sensory deficit. She exhibits normal muscle tone. Coordination and gait normal.  Skin: Skin is warm and dry. No rash noted.  Psychiatric: She has a normal mood and affect. Her behavior is normal. Judgment and thought content normal.  Nursing note and vitals reviewed.   Urgent Care Course     Procedures (including critical care time)  Labs Review Labs Reviewed - No data to display  Imaging Review No results found.   Visual Acuity Review  Right Eye Distance:   Left Eye Distance:   Bilateral Distance:    Right Eye Near:   Left Eye Near:    Bilateral Near:         MDM   1. Other trigeminal autonomic cephalgia (TAC), not intractable    #1 diagnosis is suggested due to possible radiation or radicular type pain from a tooth that is been bothering her in the right upper jaw. Consideration of a branch of the trigeminal nerve may be affected. Other considerations are muscle tension type headache. Doubt arteritis. Neurologic exam is completely normal. No other findings on physical exam. Upon entering the room she was laughing while talking on the phone. She shows no signs of acute illness or distress. The accompanying instructions are general information regarding certain types of headaches. Her headache may be coming from neuralgia type pain possibly from the tooth that you are telling me about. It may be due to muscle tension or something else. It does not sound like it is anything serious at this time. Recommend taking ibuprofen 600 mg every 6-8 hours as needed for headache. If it becomes worse or develops new  symptoms or directly to the emergency department. The symptoms are listed in the instructions accompanying this page. Also tried applying cold compresses to the right side of the head.     Hayden Rasmussen, NP 10/16/16 1650

## 2016-10-16 NOTE — ED Triage Notes (Signed)
PT reports right sided headache and right eye pain. PT reports it started Saturday. PT tried a BC powder and it got better, but never fully resolved.

## 2016-10-16 NOTE — Discharge Instructions (Addendum)
The accompanying instructions are general information regarding certain types of headaches. Her headache may be coming from neuralgia type pain possibly from the tooth that you are telling me about. It may be due to muscle tension or something else. It does not sound like it is anything serious at this time. Recommend taking ibuprofen 600 mg every 6-8 hours as needed for headache. If it becomes worse or develops new symptoms or directly to the emergency department. The symptoms are listed in the instructions accompanying this page. Also tried applying cold compresses to the right side of the head.

## 2016-10-16 NOTE — ED Triage Notes (Signed)
PT denies vision changes.

## 2016-10-30 ENCOUNTER — Emergency Department (HOSPITAL_COMMUNITY): Payer: BLUE CROSS/BLUE SHIELD

## 2016-10-30 ENCOUNTER — Emergency Department (HOSPITAL_COMMUNITY)
Admission: EM | Admit: 2016-10-30 | Discharge: 2016-10-30 | Disposition: A | Discharge status: Home / Self Care | Payer: BLUE CROSS/BLUE SHIELD | Attending: Emergency Medicine | Admitting: Emergency Medicine

## 2016-10-30 ENCOUNTER — Encounter (HOSPITAL_COMMUNITY): Payer: Self-pay | Admitting: Emergency Medicine

## 2016-10-30 DIAGNOSIS — R51 Headache: Secondary | ICD-10-CM | POA: Insufficient documentation

## 2016-10-30 DIAGNOSIS — R519 Headache, unspecified: Secondary | ICD-10-CM

## 2016-10-30 MED ORDER — ASPIRIN-ACETAMINOPHEN-CAFFEINE 250-250-65 MG PO TABS
2.0000 | ORAL_TABLET | Freq: Once | ORAL | Status: AC
  Administered 2016-10-30: 2 via ORAL
  Filled 2016-10-30: qty 2

## 2016-10-30 NOTE — ED Provider Notes (Addendum)
[facility]-EMERGENCY DEPT Provider Note   CSN: [id number] Arrival date & time: 10/30/16  1125   By signing my name below, I, Melody Salas, attest that this documentation has been prepared under the direction and in the presence of Melody Salas, Braxton Weisbecker, MD. Electronically Signed: Soijett Salas, ED Scribe. 10/30/16. 2:16 PM.  History   Chief Complaint Chief Complaint  Patient presents with  . Headache    HPI Melody Salas is a 46 y.o. female with a PMHx of HA, who presents to the Emergency Department complaining of intermittent, dull, right sided, HA onset 1 month ago. She states that she typically wakes up with a headache that gradually resolves throughout the day. She notes that this HA is not similar to headaches that she has had in the past due to the localization and duration of the headache. She states that she was evaluated approximately 2 weeks ago for the same HA at Urgent Care. Pt reports that she hasn't had any imaging completed or been evaluated by a neurology to further evaluate her headaches. Pt reports associated mild nasal congestion and right eye pain. Pt has tried OTC sinus medication with her last dose being 7 AM this morning with no relief of her symptoms. She denies head injury, LOC, speech difficulty, vision change, numbness, weakness, CP, vomiting, and any other symptoms. Denies having a PCP at this time.    The history is provided by the patient. No language interpreter was used.    Past Medical History:  Diagnosis Date  . Constipation   . Headache(784.0)     There are no active problems to display for this patient.   Past Surgical History:  Procedure Laterality Date  . NO PAST SURGERIES      OB History    Gravida Para Term Preterm AB Living   5 2 2   2 2    SAB TAB Ectopic Multiple Live Births     2             Home Medications    Prior to Admission medications   Medication Sig Start Date End Date Taking? Authorizing Provider  ferrous sulfate 325 (65 FE) MG  tablet Take 325 mg by mouth daily with breakfast.    [provider]    Family History Family History  Problem Relation Age of Onset  . Cancer Maternal Grandmother   . Hypertension Mother   . Other Neg Hx     Social History Social History  Substance Use Topics  . Smoking status: Never Smoker  . Smokeless tobacco: Never Used  . Alcohol use No     Allergies   Patient has no known allergies.   Review of Systems Review of Systems  HENT: Positive for congestion.   Eyes: Positive for pain (right). Negative for visual disturbance.  Cardiovascular: Negative for chest pain.  Gastrointestinal: Negative for vomiting.  Neurological: Positive for headaches (right sided). Negative for syncope, speech difficulty, weakness and numbness.  All other systems reviewed and are negative.    Physical Exam Updated Vital Signs BP 126/76 (BP Location: Right Arm)   Pulse 68   Temp 98.1 F (36.7 C) (Oral)   Resp 16   Ht 5\' 7"  (1.702 m)   Wt 179 lb 11.2 oz (81.5 kg)   SpO2 98%   BMI 28.14 kg/m   Physical Exam  Constitutional: She is oriented to person, place, and time. She appears well-developed and well-nourished. No distress.  HENT:  Head: Normocephalic and atraumatic.  Right  Ear: Tympanic membrane, external ear and ear canal normal.  Left Ear: Tympanic membrane, external ear and ear canal normal.  Nose: Nose normal.  Mouth/Throat: Oropharynx is clear and moist.  No sinus or temporal tenderness.  Eyes: Conjunctivae and EOM are normal. Pupils are equal, round, and reactive to light. No scleral icterus.  Neck: Neck supple. No tracheal deviation present. No thyromegaly present.  No stiffness or rigidity.   Cardiovascular: Normal rate, regular rhythm, normal heart sounds and intact distal pulses.  Exam reveals no gallop and no friction rub.   No murmur heard. Pulmonary/Chest: Effort normal and breath sounds normal. No respiratory distress. She has no wheezes. She has no rales.   Abdominal: Soft. Normal appearance and bowel sounds are normal. She exhibits no distension. There is no tenderness.  Genitourinary:  Genitourinary Comments: No cva tenderness.  Musculoskeletal: Normal range of motion. She exhibits no edema or tenderness.  Neurological: She is alert and oriented to person, place, and time. No cranial nerve deficit.  Speech normal/fluent. Motor intact bilaterally. stre 5/5. Steady gait.   Skin: Skin is warm and dry. No rash noted. She is not diaphoretic.  Psychiatric: She has a normal mood and affect. Her behavior is normal.  Nursing note and vitals reviewed.    ED Treatments / Results  DIAGNOSTIC STUDIES: Oxygen Saturation is 98% on RA, nl by my interpretation.    COORDINATION OF CARE: 2:14 PM Discussed treatment plan with pt at bedside which includes CT head, and pt agreed to plan.   Labs (all labs ordered are listed, but only abnormal results are displayed) Labs Reviewed - No data to display  EKG  EKG Interpretation None       Radiology Ct Head Wo Contrast  Result Date: 10/30/2016 CLINICAL DATA:  Recurrent headaches EXAM: CT HEAD WITHOUT CONTRAST TECHNIQUE: Contiguous axial images were obtained from the base of the skull through the vertex without intravenous contrast. COMPARISON:  None. FINDINGS: Brain: No evidence of acute infarction, hemorrhage, hydrocephalus, extra-axial collection or mass lesion/mass effect. Vascular: No hyperdense vessel or unexpected calcification. Skull: Negative Sinuses/Orbits: Mild mucosal edema paranasal sinuses without air-fluid level. Normal orbit. Other: None IMPRESSION: Normal CT head. Mild mucosal edema paranasal sinuses. Electronically Signed   By: Marlan Palau M.D.   On: 10/30/2016 15:11    Procedures Procedures (including critical care time)  Medications Ordered in ED Medications - No data to display   Initial Impression / Assessment and Plan / ED Course  I have reviewed the triage vital signs and  the nursing notes.  Pertinent labs & imaging results that were available during my care of the patient were reviewed by me and considered in my medical decision making (see chart for details).  Patient reports very frequent headaches for past several weeks, which is unusual for her.  Give increased/frequent headaches, will get imaging.  Neurologic exam non focal.   afeb.  excedrin po.  Pt appears stable for d/c.  Also notes chronically discolored with upper tooth - states dentist had recommended crown.  No tooth pain. No abscess noted to area.    Final Clinical Impressions(s) / ED Diagnoses   Final diagnoses:  None    New Prescriptions New Prescriptions   No medications on file   I personally performed the services described in this documentation, which was scribed in my presence. The recorded information has been reviewed and considered. Melody Laine, MD     Melody Laine, MD 10/30/16 1512    Melody Laine, MD  10/30/16 1522  

## 2016-10-30 NOTE — ED Notes (Signed)
Pt stable, understands discharge instructions, and reasons for return.   

## 2016-10-30 NOTE — ED Triage Notes (Signed)
Pt reports right frontal headache with pain also behind right eye. Denies visual difficulty. A/o x4. NAD at triage.

## 2016-10-30 NOTE — Discharge Instructions (Signed)
It was our pleasure to provide your ER care today - we hope that you feel better.  Take acetaminophen and/or ibuprofen as need for pain.  If allergy symptoms, also try zyrtec-d as need for symptom relief.  Follow up with dentist as planned - discuss treatment options with them.  Return to ER if worse, new symptoms, high fevers, severe or intractable pain, other concern.

## 2017-11-07 ENCOUNTER — Encounter (HOSPITAL_COMMUNITY): Payer: Self-pay | Admitting: Emergency Medicine

## 2017-11-07 ENCOUNTER — Ambulatory Visit (HOSPITAL_COMMUNITY)
Admission: EM | Admit: 2017-11-07 | Discharge: 2017-11-07 | Disposition: A | Discharge status: Home / Self Care | Payer: BLUE CROSS/BLUE SHIELD | Attending: Family Medicine | Admitting: Family Medicine

## 2017-11-07 DIAGNOSIS — J302 Other seasonal allergic rhinitis: Secondary | ICD-10-CM | POA: Diagnosis not present

## 2017-11-07 MED ORDER — IPRATROPIUM BROMIDE 0.06 % NA SOLN: 2.0000 | Freq: Four times a day (QID) | NASAL | 0 refills | Status: AC

## 2017-11-07 MED ORDER — BENZONATATE 100 MG PO CAPS: 100.0000 mg | ORAL_CAPSULE | Freq: Three times a day (TID) | ORAL | 0 refills | Status: AC

## 2017-11-07 MED ORDER — TRIAMCINOLONE ACETONIDE 55 MCG/ACT NA AERO: 2.0000 | INHALATION_SPRAY | Freq: Every day | NASAL | 12 refills | Status: AC

## 2017-11-07 MED ORDER — CETIRIZINE HCL 10 MG PO TABS: 10.0000 mg | ORAL_TABLET | Freq: Every day | ORAL | 0 refills | Status: AC

## 2017-11-07 NOTE — ED Triage Notes (Signed)
PT reports SOB for 1 month. PT developed a cough yesterday. No asthma history.

## 2017-11-07 NOTE — Discharge Instructions (Addendum)
As discussed, symptoms could be due to drainage going down her throat, or acid reflux coming up.  Tessalon for cough. Start Nasacort, atrovent nasal spray, zyrtec for nasal congestion/drainage. You can use over the counter nasal saline rinse such as neti pot for nasal congestion. Keep hydrated, your urine should be clear to pale yellow in color. Tylenol/motrin for fever and pain. Monitor for any worsening of symptoms, fever, chest pain, shortness of breath, wheezing, swelling of the throat, follow up for reevaluation.  You can also take Zantac for the next few days to ensure acid reflux is not causing her symptoms.

## 2017-11-07 NOTE — ED Provider Notes (Signed)
[facility]    CSN: [id number] Arrival date & time: 11/07/17  1345     History   Chief Complaint Chief Complaint  Patient presents with  . URI    HPI Melody Salas is a 47 y.o. female.   47 year old female comes in for 1 month history of shortness of breath and onset of cough yesterday.  Shortness of breath is more described as globus sensation, where she feels like there is something in her throat causing her to have to clear her throat/cough to be able to talk/breathe.  This is worse when she tries to talk or laugh.  Denies chest pain.  States feels as if she has some wheezing first thing in the morning.  States she has had similar episodes in the past, when the season changes.  She has had some rhinorrhea, nasal congestion, eye itching, sneezing.  She denies fever, chills, night sweats.  Denies abdominal pain, nausea, vomiting.  Denies shortness of breath associated with food.  Denies personal history of heart disease.  Never smoker.  Took Robitussin with some relief.     Past Medical History:  Diagnosis Date  . Constipation   . Headache(784.0)     There are no active problems to display for this patient.   Past Surgical History:  Procedure Laterality Date  . NO PAST SURGERIES      OB History    Gravida  5   Para  2   Term  2   Preterm      AB  2   Living  2     SAB      TAB  2   Ectopic      Multiple      Live Births               Home Medications    Prior to Admission medications   Medication Sig Start Date End Date Taking? Authorizing Provider  ferrous sulfate 325 (65 FE) MG tablet Take 325 mg by mouth daily with breakfast.   Yes [provider]  benzonatate (TESSALON) 100 MG capsule Take 1 capsule (100 mg total) by mouth every 8 (eight) hours. 11/07/17   Cathie Hoops, Amy V, PA-C  cetirizine (ZYRTEC) 10 MG tablet Take 1 tablet (10 mg total) by mouth daily. 11/07/17   Cathie Hoops, Amy V, PA-C  ipratropium (ATROVENT) 0.06 % nasal spray  Place 2 sprays into both nostrils 4 (four) times daily. 11/07/17   Cathie Hoops, Amy V, PA-C  triamcinolone (NASACORT) 55 MCG/ACT AERO nasal inhaler Place 2 sprays into the nose daily. 11/07/17   Belinda Fisher, PA-C    Family History Family History  Problem Relation Age of Onset  . Cancer Maternal Grandmother   . Hypertension Mother   . Other Neg Hx     Social History Social History   Tobacco Use  . Smoking status: Never Smoker  . Smokeless tobacco: Never Used  Substance Use Topics  . Alcohol use: No  . Drug use: No     Allergies   Patient has no known allergies.   Review of Systems Review of Systems  Reason unable to perform ROS: See HPI as above.     Physical Exam Triage Vital Signs ED Triage Vitals  Enc Vitals Group     BP 11/07/17 1430 (!) 146/84     Pulse Rate 11/07/17 1430 93     Resp 11/07/17 1430 16     Temp 11/07/17 1430 98.2 F (36.8  C)     Temp Source 11/07/17 1430 Oral     SpO2 11/07/17 1430 100 %     Weight 11/07/17 1428 174 lb (78.9 kg)     Height --      Head Circumference --      Peak Flow --      Pain Score 11/07/17 1428 0     Pain Loc --      Pain Edu? --      Excl. in GC? --    No data found.  Updated Vital Signs BP (!) 146/84   Pulse 93   Temp 98.2 F (36.8 C) (Oral)   Resp 16   Wt 174 lb (78.9 kg)   LMP 10/25/2017   SpO2 100%   BMI 27.25 kg/m   Physical Exam  Constitutional: She is oriented to person, place, and time. She appears well-developed and well-nourished. No distress.  HENT:  Head: Normocephalic and atraumatic.  Right Ear: Tympanic membrane, external ear and ear canal normal. Tympanic membrane is not erythematous and not bulging.  Left Ear: Tympanic membrane, external ear and ear canal normal. Tympanic membrane is not erythematous and not bulging.  Nose: Rhinorrhea present. Right sinus exhibits no maxillary sinus tenderness and no frontal sinus tenderness. Left sinus exhibits no maxillary sinus tenderness and no frontal sinus  tenderness.  Mouth/Throat: Uvula is midline, oropharynx is clear and moist and mucous membranes are normal.  Eyes: Pupils are equal, round, and reactive to light. Conjunctivae are normal.  Neck: Normal range of motion. Neck supple.  Cardiovascular: Normal rate, regular rhythm and normal heart sounds. Exam reveals no gallop and no friction rub.  No murmur heard. Pulmonary/Chest: Effort normal and breath sounds normal. No stridor. No respiratory distress. She has no decreased breath sounds. She has no wheezes. She has no rhonchi. She has no rales.  Abdominal: Soft. Bowel sounds are normal. There is no tenderness. There is no rebound and no guarding.  Lymphadenopathy:    She has no cervical adenopathy.  Neurological: She is alert and oriented to person, place, and time.  Skin: Skin is warm and dry. She is not diaphoretic.  Psychiatric: She has a normal mood and affect. Her behavior is normal. Judgment normal.     UC Treatments / Results  Labs (all labs ordered are listed, but only abnormal results are displayed) Labs Reviewed - No data to display  EKG None  Radiology No results found.  Procedures Procedures (including critical care time)  Medications Ordered in UC Medications - No data to display  Initial Impression / Assessment and Plan / UC Course  I have reviewed the triage vital signs and the nursing notes.  Pertinent labs & imaging results that were available during my care of the patient were reviewed by me and considered in my medical decision making (see chart for details).    Patient speaking in full sentences without difficulty or distress. No obvious labored breathing, no tachycardia/tachypnea, O2 sat 100%. Lungs clear to auscultation bilaterally. Description of SOB more consistent with globus sensation, possibly dur to post nasal drip/allergic rhinitis vs GERD. Will have patient try symptomatic treatment. Return precautions given. Patient expresses understanding and  agrees to plan.  Final Clinical Impressions(s) / UC Diagnoses   Final diagnoses:  Seasonal allergic rhinitis, unspecified trigger   ED Prescriptions    Medication Sig Dispense Auth. Provider   cetirizine (ZYRTEC) 10 MG tablet Take 1 tablet (10 mg total) by mouth daily. 15 tablet Belinda Fisher,  PA-C   ipratropium (ATROVENT) 0.06 % nasal spray Place 2 sprays into both nostrils 4 (four) times daily. 15 mL Yu, Amy V, PA-C   triamcinolone (NASACORT) 55 MCG/ACT AERO nasal inhaler Place 2 sprays into the nose daily. 1 Inhaler Yu, Amy V, PA-C   benzonatate (TESSALON) 100 MG capsule Take 1 capsule (100 mg total) by mouth every 8 (eight) hours. 21 capsule Threasa Alpha, [place] 11/07/17 [phone]

## 2018-05-08 ENCOUNTER — Ambulatory Visit: Payer: BLUE CROSS/BLUE SHIELD | Admitting: Family

## 2018-08-21 ENCOUNTER — Ambulatory Visit: Payer: BLUE CROSS/BLUE SHIELD | Admitting: Family

## 2019-01-23 ENCOUNTER — Other Ambulatory Visit: Payer: Self-pay

## 2019-01-23 DIAGNOSIS — Z20822 Contact with and (suspected) exposure to covid-19: Secondary | ICD-10-CM

## 2019-01-25 LAB — NOVEL CORONAVIRUS, NAA: SARS-CoV-2, NAA: NOT DETECTED

## 2019-04-27 ENCOUNTER — Encounter (HOSPITAL_COMMUNITY): Payer: Self-pay

## 2019-04-27 ENCOUNTER — Emergency Department (HOSPITAL_COMMUNITY)
Admission: EM | Admit: 2019-04-27 | Discharge: 2019-04-27 | Disposition: A | Discharge status: Home / Self Care | Payer: BLUE CROSS/BLUE SHIELD | Attending: Emergency Medicine | Admitting: Emergency Medicine

## 2019-04-27 DIAGNOSIS — U071 COVID-19: Secondary | ICD-10-CM | POA: Diagnosis not present

## 2019-04-27 DIAGNOSIS — Z20822 Contact with and (suspected) exposure to covid-19: Secondary | ICD-10-CM

## 2019-04-27 DIAGNOSIS — Z79899 Other long term (current) drug therapy: Secondary | ICD-10-CM | POA: Insufficient documentation

## 2019-04-27 DIAGNOSIS — R509 Fever, unspecified: Secondary | ICD-10-CM | POA: Diagnosis present

## 2019-04-27 DIAGNOSIS — B349 Viral infection, unspecified: Secondary | ICD-10-CM

## 2019-04-27 NOTE — ED Notes (Signed)
Patient verbalizes understanding of discharge instructions. Opportunity for questioning and answers were provided. Armband removed by staff, pt discharged from ED.  

## 2019-04-27 NOTE — Discharge Instructions (Signed)
You were seen in the ED for symptoms concerning for COVID.   I suspect you have a virus.  This could be influenza, COVID, other virus.    We tested your for COVID-19 (coronavirus) infection.  It is also possible you could have other viral upper respiratory infection from another virus.    Other testing done today was normal.   COVID test results come back in 48-72 hours, sometimes sooner.  Someone will call you to notify you of results.  You can also check MyChart for formal results that will be posted.   Treatment of your illness and symptoms for now will include self-isolation, monitoring of symptoms and supportive care with over-the-counter medicines.    Stay well-hydrated. Rest. You can use over the counter medications to help with symptoms: [phone] mg acetaminophen (tylenol) every 6 hours, around the clock to help with associated fevers, sore throat, headaches, generalized body aches and malaise.  Oxymetazoline (afrin) intranasal spray once daily for no more than 3 days to help with congestion, after 3 days you can switch to another over-the-counter nasal steroid spray such as fluticasone (flonase) Allergy medication (loratadine, cetirizine, etc) and phenylephrine (sudafed) help with nasal congestion, runny nose and postnasal drip.   Dextromethorphan (Delsym) to suppress dry cough. Frequent coughing is likely causing your chest wall pain Guaifenesin (Mucinex) to help expectorate mucus and cough Wash your hands often to prevent spread.  Stay hydrated with plenty of clear fluids Rest   Return to the ED if symptoms are worsening or severe, there is increased work of breathing, chest pain or shortness of breath with exertion or activity, inability to tolerate fluids due to persistent vomiting despite nausea medicines, passing out, light headedness.  If your test results are POSITIVE, the following isolation requirements need to be met to return to work and resume essential activities: At  least 14 days since symptom onset  72 hours of absence of fever without antifever medicine (ibuprofen, acetaminophen). A fever is temperature of 100.57F or greater. Improvement of respiratory symptoms  If your test is NEGATIVE, you may return to work and essential activities as long as your symptoms have improved and you do not have a fever for a total of 3 days.  Call your job and notify them that your test result was negative to see if they will allow you to return to work.     Infection Prevention Recommendations for Individuals Confirmed to have, or Being Evaluated for, or have symptoms of 2019 Novel Coronavirus (COVID-19) Infection Who Receive Care at Home  Individuals who are confirmed to have, or are being evaluated for, COVID-19 should follow the prevention steps below until a healthcare provider or local or state health department says they can return to normal activities.  Stay home except to get medical care You should restrict activities outside your home, except for getting medical care. Do not go to work, school, or public areas, and do not use public transportation or taxis.  Call ahead before visiting your doctor Before your medical appointment, call the healthcare provider and tell them that you have, or are being evaluated for, COVID-19 infection. This will help the healthcare providers office take steps to keep other people from getting infected. Ask your healthcare provider to call the local or state health department.  Monitor your symptoms Seek prompt medical attention if your illness is worsening (e.g., difficulty breathing). Before going to your medical appointment, call the healthcare provider and tell them that you have, or are being  evaluated for, COVID-19 infection. Ask your healthcare provider to call the local or state health department.  Wear a facemask You should wear a facemask that covers your nose and mouth when you are in the same room with other people  and when you visit a healthcare provider. People who live with or visit you should also wear a facemask while they are in the same room with you.  Separate yourself from other people in your home As much as possible, you should stay in a different room from other people in your home. Also, you should use a separate bathroom, if available.  Avoid sharing household items You should not share dishes, drinking glasses, cups, eating utensils, towels, bedding, or other items with other people in your home. After using these items, you should wash them thoroughly with soap and water.  Cover your coughs and sneezes Cover your mouth and nose with a tissue when you cough or sneeze, or you can cough or sneeze into your sleeve. Throw used tissues in a lined trash can, and immediately wash your hands with soap and water for at least 20 seconds or use an alcohol-based hand rub.  Wash your [facility] your hands often and thoroughly with soap and water for at least 20 seconds. You can use an alcohol-based hand sanitizer if soap and water are not available and if your hands are not visibly dirty. Avoid touching your eyes, nose, and mouth with unwashed hands.   Prevention Steps for Caregivers and Household Members of Individuals Confirmed to have, or Being Evaluated for, or have symptoms of 2019 Novel Coronavirus (COVID-19) Infection Being Cared for in the Home  If you live with, or provide care at home for, a person confirmed to have, or being evaluated for, COVID-19 infection please follow these guidelines to prevent infection:  Follow healthcare providers instructions Make sure that you understand and can help the patient follow any healthcare provider instructions for all care.  Provide for the patients basic needs You should help the patient with basic needs in the home and provide support for getting groceries, prescriptions, and other personal needs.  Monitor the patients symptoms If they are  getting sicker, call his or her medical provider and tell them that the patient has, or is being evaluated for, COVID-19 infection. This will help the healthcare providers office take steps to keep other people from getting infected. Ask the healthcare provider to call the local or state health department.  Limit the number of people who have contact with the patient If possible, have only one caregiver for the patient. Other household members should stay in another home or place of residence. If this is not possible, they should stay in another room, or be separated from the patient as much as possible. Use a separate bathroom, if available. Restrict visitors who do not have an essential need to be in the home.  Keep older adults, very young children, and other sick people away from the patient Keep older adults, very young children, and those who have compromised immune systems or chronic health conditions away from the patient. This includes people with chronic heart, lung, or kidney conditions, diabetes, and cancer.  Ensure good ventilation Make sure that shared spaces in the home have good air flow, such as from an air conditioner or an opened window, weather permitting.  Wash your hands often Wash your hands often and thoroughly with soap and water for at least 20 seconds. You can use an alcohol  based hand sanitizer if soap and water are not available and if your hands are not visibly dirty. Avoid touching your eyes, nose, and mouth with unwashed hands. Use disposable paper towels to dry your hands. If not available, use dedicated cloth towels and replace them when they become wet.  Wear a facemask and gloves Wear a disposable facemask at all times in the room and gloves when you touch or have contact with the patients blood, body fluids, and/or secretions or excretions, such as sweat, saliva, sputum, nasal mucus, vomit, urine, or feces.  Ensure the mask fits over your nose and mouth  tightly, and do not touch it during use. Throw out disposable facemasks and gloves after using them. Do not reuse. Wash your hands immediately after removing your facemask and gloves. If your personal clothing becomes contaminated, carefully remove clothing and launder. Wash your hands after handling contaminated clothing. Place all used disposable facemasks, gloves, and other waste in a lined container before disposing them with other household waste. Remove gloves and wash your hands immediately after handling these items.  Do not share dishes, glasses, or other household items with the patient Avoid sharing household items. You should not share dishes, drinking glasses, cups, eating utensils, towels, bedding, or other items with a patient who is confirmed to have, or being evaluated for, COVID-19 infection. After the person uses these items, you should wash them thoroughly with soap and water.  Wash laundry thoroughly Immediately remove and wash clothes or bedding that have blood, body fluids, and/or secretions or excretions, such as sweat, saliva, sputum, nasal mucus, vomit, urine, or feces, on them. Wear gloves when handling laundry from the patient. Read and follow directions on labels of laundry or clothing items and detergent. In general, wash and dry with the warmest temperatures recommended on the label.  Clean all areas the individual has used often Clean all touchable surfaces, such as counters, tabletops, doorknobs, bathroom fixtures, toilets, phones, keyboards, tablets, and bedside tables, every day. Also, clean any surfaces that may have blood, body fluids, and/or secretions or excretions on them. Wear gloves when cleaning surfaces the patient has come in contact with. Use a diluted bleach solution (e.g., dilute bleach with 1 part bleach and 10 parts water) or a household disinfectant with a label that says EPA-registered for coronaviruses. To make a bleach solution at home, add 1  tablespoon of bleach to 1 quart (4 cups) of water. For a larger supply, add  cup of bleach to 1 gallon (16 cups) of water. Read labels of cleaning products and follow recommendations provided on product labels. Labels contain instructions for safe and effective use of the cleaning product including precautions you should take when applying the product, such as wearing gloves or eye protection and making sure you have good ventilation during use of the product. Remove gloves and wash hands immediately after cleaning.  Monitor yourself for signs and symptoms of illness Caregivers and household members are considered close contacts, should monitor their health, and will be asked to limit movement outside of the home to the extent possible. Follow the monitoring steps for close contacts listed on the symptom monitoring form.  ? If you have additional questions, contact your local health department or call the epidemiologist on call at [phone] (available 24/7). ? This guidance is subject to change. For the most up-to-date guidance from [facility], please refer to their website: [email or web address]

## 2019-04-27 NOTE — ED Provider Notes (Signed)
[facility] EMERGENCY DEPARTMENT Provider Note   CSN: [id number] Arrival date & time: 04/27/19  2031     History   Chief Complaint Chief Complaint  Patient presents with  . Influenza    HPI Melody Salas is a 48 y.o. female presents to the ER for evaluation of fever of 102, headache, nasal congestion, scratchy throat, cough, generalized fatigue and body aches.  Onset last Friday.  Reports throat was "scratchy" and had a mild cough but these have resolved.  Took BC powder prior to arrival for frontal headache has improved as well.  States he was with her brother on Friday the day before the symptoms began and he had a cough.  He told her that he had tested negative for COVID-19 but she is unsure if he was being truthful about it.  She works at the [employer] and last week a customer came in saying that he had "the virus".  She sits behind a plexiglass but is still concerned about the possible exposure.  She denies any travel.  No sick contacts at home.  Denies current sore throat, chest pain, shortness of breath, nausea, vomiting, diarrhea, abdominal pain.  History of hypertension but no other cardiac or pulmonary disease.  No modifying factors.     HPI  Past Medical History:  Diagnosis Date  . Constipation   . Headache(784.0)     There are no active problems to display for this patient.   Past Surgical History:  Procedure Laterality Date  . NO PAST SURGERIES       OB History    Gravida  5   Para  2   Term  2   Preterm      AB  2   Living  2     SAB      TAB  2   Ectopic      Multiple      Live Births               Home Medications    Prior to Admission medications   Medication Sig Start Date End Date Taking? Authorizing Provider  benzonatate (TESSALON) 100 MG capsule Take 1 capsule (100 mg total) by mouth every 8 (eight) hours. 11/07/17   Tasia Catchings, Amy V, PA-C  cetirizine (ZYRTEC) 10 MG tablet Take 1 tablet (10 mg total) by mouth daily.  11/07/17   Tasia Catchings, Amy V, PA-C  ferrous sulfate 325 (65 FE) MG tablet Take 325 mg by mouth daily with breakfast.    [provider]  ipratropium (ATROVENT) 0.06 % nasal spray Place 2 sprays into both nostrils 4 (four) times daily. 11/07/17   Tasia Catchings, Amy V, PA-C  triamcinolone (NASACORT) 55 MCG/ACT AERO nasal inhaler Place 2 sprays into the nose daily. 11/07/17   Ok Edwards, PA-C    Family History Family History  Problem Relation Age of Onset  . Cancer Maternal Grandmother   . Hypertension Mother   . Other Neg Hx     Social History Social History   Tobacco Use  . Smoking status: Never Smoker  . Smokeless tobacco: Never Used  Substance Use Topics  . Alcohol use: No  . Drug use: No     Allergies   Patient has no known allergies.   Review of Systems Review of Systems  Constitutional: Positive for fatigue and fever.  HENT: Positive for congestion and sore throat (Resolved).   Respiratory: Positive for cough (Resolved).   Musculoskeletal: Positive for myalgias.  Neurological: Positive for headaches.  All other systems reviewed and are negative.    Physical Exam Updated Vital Signs BP 112/73 (BP Location: Right Arm)   Pulse 91   Temp 98.9 F (37.2 C) (Oral)   Resp 18   Ht 5\' 6"  (1.676 m)   Wt 81.6 kg   SpO2 100%   BMI 29.05 kg/m   Physical Exam Vitals signs and nursing note reviewed.  Constitutional:      General: She is not in acute distress.    Appearance: She is well-developed.     Comments: Sounds congested but nontoxic.  HENT:     Head: Normocephalic and atraumatic.     Right Ear: External ear normal.     Left Ear: External ear normal.     Nose: Congestion and rhinorrhea present.     Comments: Nasal mucosa is erythematous, edematous.  Clear rhinorrhea bilaterally.  No sinus tenderness.  Septum is midline    Mouth/Throat:     Comments: Normal oropharynx and tonsils without erythema, hypertrophy, exudates or petechiae.  Uvula is midline. Eyes:      General: No scleral icterus.    Conjunctiva/sclera: Conjunctivae normal.  Neck:     Musculoskeletal: Normal range of motion and neck supple.     Comments: No cervical lymphadenopathy Cardiovascular:     Rate and Rhythm: Normal rate and regular rhythm.     Heart sounds: Normal heart sounds. No murmur.  Pulmonary:     Effort: Pulmonary effort is normal.     Breath sounds: Normal breath sounds. No wheezing.  Musculoskeletal: Normal range of motion.        General: No deformity.  Skin:    General: Skin is warm and dry.     Capillary Refill: Capillary refill takes less than 2 seconds.  Neurological:     Mental Status: She is alert and oriented to person, place, and time.  Psychiatric:        Behavior: Behavior normal.        Thought Content: Thought content normal.        Judgment: Judgment normal.      ED Treatments / Results  Labs (all labs ordered are listed, but only abnormal results are displayed) Labs Reviewed  NOVEL CORONAVIRUS, NAA (HOSP ORDER, SEND-OUT TO REF LAB; TAT 18-24 HRS)    EKG None  Radiology No results found.  Procedures Procedures (including critical care time)  Medications Ordered in ED Medications - No data to display   Initial Impression / Assessment and Plan / ED Course  I have reviewed the triage vital signs and the nursing notes.  Pertinent labs & imaging results that were available during my care of the patient were reviewed by me and considered in my medical decision making (see chart for details).  Highest on DDX is viral process including COVID-19, influenza versus other viral illness.  Several possible sick contacts recently.  She works at the [employer].  Exam is reassuring, patient checked in with low-grade fever but this is resolved.  Nontoxic-appearing.  Normal work of breathing.  No hypoxemia.  Lungs are clear bilaterally.  Her cough has resolved.  I do not think emergent chest x-ray is indicated today.  No other concerning signs or symptoms  to warrant further emergent work-up or labs.  Doubt bacterial bronchitis, pneumonia.  No signs of strep pharyngitis.  Given symptoms, will test for COVID-19.  I think she is appropriate for discharge with symptomatic management.  Discussed treatments over-the-counter for viral  illness.  Isolation instructions discussed.  Return precautions discussed.  She is comfortable with this plan.  Final Clinical Impressions(s) / ED Diagnoses   Final diagnoses:  Viral syndrome  Suspected COVID-19 virus infection    ED Discharge Orders    None       Liberty Handy, PA-C 04/27/19 2349    Jacalyn Lefevre, MD 05/03/19 1627

## 2019-04-27 NOTE — ED Triage Notes (Signed)
Pt states that she has a headache, fever, scratchy throat for the past week. Denies sick contacts

## 2019-04-27 NOTE — ED Notes (Signed)
Pt called for triage x1 with no answer

## 2019-04-30 LAB — NOVEL CORONAVIRUS, NAA (HOSP ORDER, SEND-OUT TO REF LAB; TAT 18-24 HRS): SARS-CoV-2, NAA: DETECTED — AB

## 2021-05-15 ENCOUNTER — Emergency Department (HOSPITAL_COMMUNITY)
Admission: EM | Admit: 2021-05-15 | Discharge: 2021-05-15 | Disposition: A | Discharge status: Home / Self Care | Payer: BLUE CROSS/BLUE SHIELD | Attending: Emergency Medicine | Admitting: Emergency Medicine

## 2021-05-15 ENCOUNTER — Emergency Department (HOSPITAL_COMMUNITY): Payer: BLUE CROSS/BLUE SHIELD

## 2021-05-15 ENCOUNTER — Other Ambulatory Visit: Payer: Self-pay

## 2021-05-15 ENCOUNTER — Encounter (HOSPITAL_COMMUNITY): Payer: Self-pay

## 2021-05-15 DIAGNOSIS — R519 Headache, unspecified: Secondary | ICD-10-CM | POA: Diagnosis not present

## 2021-05-15 DIAGNOSIS — Z20822 Contact with and (suspected) exposure to covid-19: Secondary | ICD-10-CM | POA: Insufficient documentation

## 2021-05-15 DIAGNOSIS — R000 Tachycardia, unspecified: Secondary | ICD-10-CM | POA: Insufficient documentation

## 2021-05-15 DIAGNOSIS — H53149 Visual discomfort, unspecified: Secondary | ICD-10-CM | POA: Diagnosis not present

## 2021-05-15 DIAGNOSIS — I10 Essential (primary) hypertension: Secondary | ICD-10-CM | POA: Insufficient documentation

## 2021-05-15 HISTORY — DX: Essential (primary) hypertension: I10

## 2021-05-15 LAB — CBC WITH DIFFERENTIAL/PLATELET
Abs Immature Granulocytes: 0.07 10*3/uL (ref 0.00–0.07)
Basophils Absolute: 0.1 10*3/uL (ref 0.0–0.1)
Basophils Relative: 1 %
Eosinophils Absolute: 0.4 10*3/uL (ref 0.0–0.5)
Eosinophils Relative: 3 %
HCT: 34.3 % — ABNORMAL LOW (ref 36.0–46.0)
Hemoglobin: 10.6 g/dL — ABNORMAL LOW (ref 12.0–15.0)
Immature Granulocytes: 1 %
Lymphocytes Relative: 20 %
Lymphs Abs: 2.5 10*3/uL (ref 0.7–4.0)
MCH: 20.7 pg — ABNORMAL LOW (ref 26.0–34.0)
MCHC: 30.9 g/dL (ref 30.0–36.0)
MCV: 67.1 fL — ABNORMAL LOW (ref 80.0–100.0)
Monocytes Absolute: 0.8 10*3/uL (ref 0.1–1.0)
Monocytes Relative: 7 %
Neutro Abs: 8.5 10*3/uL — ABNORMAL HIGH (ref 1.7–7.7)
Neutrophils Relative %: 68 %
Platelets: 649 10*3/uL — ABNORMAL HIGH (ref 150–400)
RBC: 5.11 MIL/uL (ref 3.87–5.11)
RDW: 21.7 % — ABNORMAL HIGH (ref 11.5–15.5)
WBC: 12.3 10*3/uL — ABNORMAL HIGH (ref 4.0–10.5)
nRBC: 0 % (ref 0.0–0.2)

## 2021-05-15 LAB — COMPREHENSIVE METABOLIC PANEL
ALT: 13 U/L (ref 0–44)
AST: 16 U/L (ref 15–41)
Albumin: 4.2 g/dL (ref 3.5–5.0)
Alkaline Phosphatase: 118 U/L (ref 38–126)
Anion gap: 8 (ref 5–15)
BUN: 12 mg/dL (ref 6–20)
CO2: 20 mmol/L — ABNORMAL LOW (ref 22–32)
Calcium: 9 mg/dL (ref 8.9–10.3)
Chloride: 110 mmol/L (ref 98–111)
Creatinine, Ser: 1.02 mg/dL — ABNORMAL HIGH (ref 0.44–1.00)
GFR, Estimated: >60 mL/min (ref >60)
Glucose, Bld: 103 mg/dL — ABNORMAL HIGH (ref 70–99)
Potassium: 3.7 mmol/L (ref 3.5–5.1)
Sodium: 138 mmol/L (ref 135–145)
Total Bilirubin: 0.4 mg/dL (ref 0.3–1.2)
Total Protein: 8.6 g/dL — ABNORMAL HIGH (ref 6.5–8.1)

## 2021-05-15 LAB — I-STAT BETA HCG BLOOD, ED (MC, WL, AP ONLY): I-stat hCG, quantitative: <5 m[IU]/mL (ref <5)

## 2021-05-15 LAB — RESP PANEL BY RT-PCR (FLU A&B, COVID) ARPGX2
Influenza A by PCR: NEGATIVE
Influenza B by PCR: NEGATIVE
SARS Coronavirus 2 by RT PCR: NEGATIVE

## 2021-05-15 LAB — TROPONIN I (HIGH SENSITIVITY): Troponin I (High Sensitivity): 5 ng/L (ref <18)

## 2021-05-15 LAB — TSH: TSH: 1.159 u[IU]/mL (ref 0.350–4.500)

## 2021-05-15 MED ORDER — IOHEXOL 350 MG/ML SOLN
80.0000 mL | Freq: Once | INTRAVENOUS | Status: AC | PRN
  Administered 2021-05-15: 80 mL via INTRAVENOUS

## 2021-05-15 MED ORDER — METOCLOPRAMIDE HCL 5 MG/ML IJ SOLN
10.0000 mg | Freq: Once | INTRAMUSCULAR | Status: DC
  Filled 2021-05-15: qty 2

## 2021-05-15 MED ORDER — ACETAMINOPHEN 325 MG PO TABS
650.0000 mg | ORAL_TABLET | Freq: Once | ORAL | Status: AC
  Administered 2021-05-15: 650 mg via ORAL
  Filled 2021-05-15: qty 2

## 2021-05-15 MED ORDER — DIPHENHYDRAMINE HCL 50 MG/ML IJ SOLN
12.5000 mg | Freq: Once | INTRAMUSCULAR | Status: DC
  Filled 2021-05-15: qty 1

## 2021-05-15 MED ORDER — METOPROLOL TARTRATE 25 MG PO TABS
12.5000 mg | ORAL_TABLET | Freq: Once | ORAL | Status: DC
  Filled 2021-05-15: qty 1

## 2021-05-15 MED ORDER — METOPROLOL TARTRATE 25 MG PO TABS: 12.5000 mg | ORAL_TABLET | Freq: Two times a day (BID) | ORAL | 0 refills | Status: AC

## 2021-05-15 MED ORDER — KETOROLAC TROMETHAMINE 15 MG/ML IJ SOLN
15.0000 mg | Freq: Once | INTRAMUSCULAR | Status: DC
  Filled 2021-05-15: qty 1

## 2021-05-15 NOTE — ED Provider Notes (Signed)
[facility]-EMERGENCY DEPT Provider Note   CSN: [id number] Arrival date & time: 05/15/21  1211     History Chief Complaint  Patient presents with   Headache    Melody Salas is a 50 y.o. female.  50 year old female presents today for evaluation of headache and hypertension since Friday.  Patient reports compliance with her home amlodipine.  She denies any visual changes.  Her headache is located anteriorly.  She is without photophobia and phonophobia.  She has had headaches like this before with Tylenol and Motrin.  She also reports Friday she had left-sided chest pain which lasted several hours and self resolved.  She denies any dyspnea, palpitations, lightheadedness, nausea, or vomiting.  She is without cough, or fever.  She did recently receive her Depo injection.  Since waiting in triage she does report that she has had improvement in her headache.  The history is provided by the patient. No language interpreter was used.      Past Medical History:  Diagnosis Date   Constipation    Headache(784.0)    Hypertension     There are no problems to display for this patient.   Past Surgical History:  Procedure Laterality Date   NO PAST SURGERIES       OB History     Gravida  5   Para  2   Term  2   Preterm      AB  2   Living  2      SAB      IAB  2   Ectopic      Multiple      Live Births              Family History  Problem Relation Age of Onset   Cancer Maternal Grandmother    Hypertension Mother    Other Neg Hx     Social History   Tobacco Use   Smoking status: Never   Smokeless tobacco: Never  Substance Use Topics   Alcohol use: No   Drug use: No    Home Medications Prior to Admission medications   Medication Sig Start Date End Date Taking? Authorizing Provider  benzonatate (TESSALON) 100 MG capsule Take 1 capsule (100 mg total) by mouth every 8 (eight) hours. 11/07/17   Cathie Hoops, Amy V, PA-C  cetirizine  (ZYRTEC) 10 MG tablet Take 1 tablet (10 mg total) by mouth daily. 11/07/17   Cathie Hoops, Amy V, PA-C  ferrous sulfate 325 (65 FE) MG tablet Take 325 mg by mouth daily with breakfast.    [provider]  ipratropium (ATROVENT) 0.06 % nasal spray Place 2 sprays into both nostrils 4 (four) times daily. 11/07/17   Cathie Hoops, Amy V, PA-C  triamcinolone (NASACORT) 55 MCG/ACT AERO nasal inhaler Place 2 sprays into the nose daily. 11/07/17   Belinda Fisher, PA-C    Allergies    Patient has no known allergies.  Review of Systems   Review of Systems  Constitutional:  Negative for activity change, appetite change and fever.  Respiratory:  Negative for chest tightness and shortness of breath.   Cardiovascular:  Negative for chest pain.  Gastrointestinal:  Negative for abdominal pain, nausea and vomiting.  Genitourinary:  Negative for dysuria.  Neurological:  Positive for headaches. Negative for light-headedness.  All other systems reviewed and are negative.  Physical Exam Updated Vital Signs BP (!) 143/101   Pulse (!) 114   Temp 97.9 F (36.6 C) (Oral)  Resp 18   Ht 5\' 7"  (1.702 m)   Wt 81 kg   SpO2 100%   BMI 27.97 kg/m   Physical Exam Vitals and nursing note reviewed.  Constitutional:      General: She is not in acute distress.    Appearance: Normal appearance. She is obese. She is not ill-appearing.  HENT:     Head: Normocephalic and atraumatic.     Nose: Nose normal.  Eyes:     General: No scleral icterus.    Extraocular Movements: Extraocular movements intact.     Conjunctiva/sclera: Conjunctivae normal.  Cardiovascular:     Rate and Rhythm: Normal rate and regular rhythm.     Pulses: Normal pulses.     Heart sounds: Normal heart sounds.  Pulmonary:     Effort: Pulmonary effort is normal. No respiratory distress.     Breath sounds: Normal breath sounds. No wheezing or rales.  Abdominal:     General: There is no distension.     Tenderness: There is no abdominal tenderness.   Musculoskeletal:        General: Normal range of motion.     Cervical back: Normal range of motion.  Skin:    General: Skin is warm and dry.  Neurological:     General: No focal deficit present.     Mental Status: She is alert. Mental status is at baseline.    ED Results / Procedures / Treatments   Labs (all labs ordered are listed, but only abnormal results are displayed) Labs Reviewed  COMPREHENSIVE METABOLIC PANEL - Abnormal; Notable for the following components:      Result Value   CO2 20 (*)    Glucose, Bld 103 (*)    Creatinine, Ser 1.02 (*)    Total Protein 8.6 (*)    All other components within normal limits  CBC WITH DIFFERENTIAL/PLATELET - Abnormal; Notable for the following components:   WBC 12.3 (*)    Hemoglobin 10.6 (*)    HCT 34.3 (*)    MCV 67.1 (*)    MCH 20.7 (*)    RDW 21.7 (*)    Platelets 649 (*)    Neutro Abs 8.5 (*)    All other components within normal limits  RESP PANEL BY RT-PCR (FLU A&B, COVID) ARPGX2  TSH  I-STAT BETA HCG BLOOD, ED ([facility], [facility], [facility] ONLY)  TROPONIN I (HIGH SENSITIVITY)    EKG EKG Interpretation  Date/Time:  Sunday May 15 2021 13:09:33 EST Ventricular Rate:  123 PR Interval:  128 QRS Duration: 71 QT Interval:  297 QTC Calculation: 425 R Axis:   76 Text Interpretation: Sinus tachycardia Probable left atrial enlargement Since last tracing rate faster Confirmed by [id number] [phone]) on 05/15/2021 5:19:41 PM  Radiology CT HEAD WO CONTRAST (05/17/2021)  Result Date: 05/15/2021 CLINICAL DATA:  Headache 3 days. No injury. Possible intracranial hemorrhage. EXAM: CT HEAD WITHOUT CONTRAST TECHNIQUE: Contiguous axial images were obtained from the base of the skull through the vertex without intravenous contrast. COMPARISON:  10/30/2016 FINDINGS: Brain: No evidence of acute infarction, hemorrhage, hydrocephalus, extra-axial collection or mass lesion/mass effect. Vascular: No hyperdense vessel or unexpected calcification. Skull: Normal.  Negative for fracture or focal lesion. Sinuses/Orbits: Orbits are normal symmetric. Paranasal sinuses are well developed and demonstrate opacification over the ethmoid air cells and maxillary sinuses compatible chronic inflammatory change. Mastoid air cells are clear. Other: None. IMPRESSION: 1. No acute findings. 2. Chronic sinus inflammatory change. Electronically Signed   By: 12/30/2016  Micheline Maze M.D.   On: 05/15/2021 13:37    Procedures Procedures   Medications Ordered in ED Medications  ketorolac (TORADOL) 15 MG/ML injection 15 mg (has no administration in time range)  metoCLOPramide (REGLAN) injection 10 mg (has no administration in time range)  diphenhydrAMINE (BENADRYL) injection 12.5 mg (has no administration in time range)    ED Course  I have reviewed the triage vital signs and the nursing notes.  Pertinent labs & imaging results that were available during my care of the patient were reviewed by me and considered in my medical decision making (see chart for details).    MDM Rules/Calculators/A&P                           51 year old female presents today with elevated BP and headache.  Patient's BP improved while in the waiting room.  Reports her headache improved as well.  Patient was tachycardic on arrival and continues to be tachycardic during my interview.  Given concern for tachycardia without apparent source such as fever, unknown anemia, dehydration, or infection I believe she would benefit from undergoing CTA to rule out PE given her tachycardia, recent Depo injection, and recent episode of chest pain.  TSH also ordered.  CTA negative for PE.  TSH normal.  Patient remains tachycardic.  This is unlikely to be ACS given EKG and negative troponin.  Will start patient on low-dose Lopressor.  Discussed importance of follow-up with PCP.  She has an appointment coming up mid December but will call to get a sooner appointment for follow-up.  Patient does not have history of cocaine use  and discussed risks of taking Lopressor with concha mitten use of cocaine including death.  Patient voiced understanding and is in agreement with plan.  Final Clinical Impression(s) / ED Diagnoses Final diagnoses:  None    Rx / DC Orders ED Discharge Orders     None        Marita Kansas, PA-C 05/15/21 1944    Charlynne Pander, MD 05/15/21 [phone]

## 2021-05-15 NOTE — ED Provider Notes (Signed)
Emergency Medicine Provider Triage Evaluation Note  Melody Salas , a 50 y.o. female  was evaluated in triage.  Pt complains of high blood pressure and headache.  She reports compliance with her amlodipine.  She reports that her head has been hurting for three days.  She denies any leg swelling or shortness of breath.  Review of Systems  Positive: Tachycardia, headache Negative: Syncope, chest pain  Physical Exam  BP (!) 143/96 (BP Location: Left Arm)   Pulse (!) 126   Temp 97.9 F (36.6 C) (Oral)   Resp 17   Ht 5\' 7"  (1.702 m)   SpO2 100%   BMI 28.19 kg/m  Gen:   Awake, no distress   Resp:  Normal effort  MSK:   Moves extremities without difficulty  Other:  Normal speech.  Heart rate is elevated and tachycardic.  Feels regular.  Medical Decision Making  Medically screening exam initiated at 12:38 PM.  Appropriate orders placed.  INAS AVENA was informed that the remainder of the evaluation will be completed by another provider, this initial triage assessment does not replace that evaluation, and the importance of remaining in the ED until their evaluation is complete.  Given patient's tachycardia with a heart rate of 126 in the setting of a headache that she says is abnormal for her will obtain labs, flu/COVID test.  Will obtain pregnancy test as she still has menstrual cycles.  EKG is ordered. Additionally given her headache and her concerns about hypertension saying that her blood pressure had been higher recently will obtain CT head.  Note: Portions of this report may have been transcribed using voice recognition software. Every effort was made to ensure accuracy; however, inadvertent computerized transcription errors may be present    Vallery Sa, PA-C 05/15/21 1248    05/17/21, MD 05/16/21 [phone]

## 2021-05-15 NOTE — Discharge Instructions (Addendum)
Your work-up in the emergency department was reassuring.  Your blood pressure improved without any additional medications in the emergency room.  He did receive Tylenol for your headache.  You noted improvement in your headache prior to leaving.  You are found to have elevated heart rate while you are in the emergency room without any other cause such as fever, infection.  He had a CT scan of your chest done to rule out a clot in your lungs.  CT scan of your chest was normal.  You have been started on metoprolol for your heart rate.  You received your first dose in the emergency room.  Additional prescription has been sent into your pharmacy.  If you develop chest pain, shortness of breath, or lightheadedness please return to the emergency room.  Otherwise follow-up with your primary care provider heart rate.

## 2021-05-15 NOTE — ED Triage Notes (Signed)
Patient reports headache and increased blood pressure starting 3 days ago. Pain 4/10

## 2023-02-20 LAB — COLOGUARD: COLOGUARD: NEGATIVE

## 2023-09-03 IMAGING — CT CT HEAD W/O CM
3 series · 14 of 47 positions shown, 16 images · non-contrast
Comparison: 10/30/2016

CLINICAL DATA: Headache 3 days. No injury. Possible intracranial
hemorrhage.

EXAM:
CT HEAD WITHOUT CONTRAST
TECHNIQUE: Contiguous axial images were obtained from the base of the skull
through the vertex without intravenous contrast.

[Series 2: head wo · axial · 0.47mm/px · z∈[-224,-89]mm · 8 of 33 slices shown, 10 images]
[im 3/33  brain]
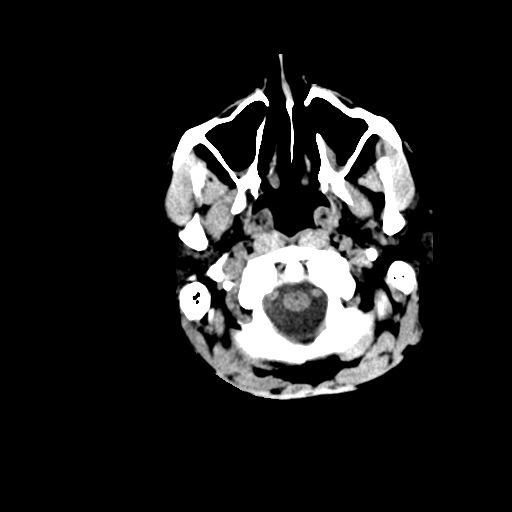
[im 3/33  bone]
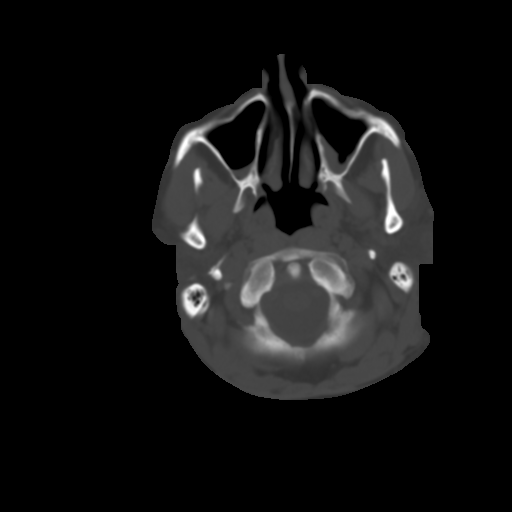
[im 7/33  brain]
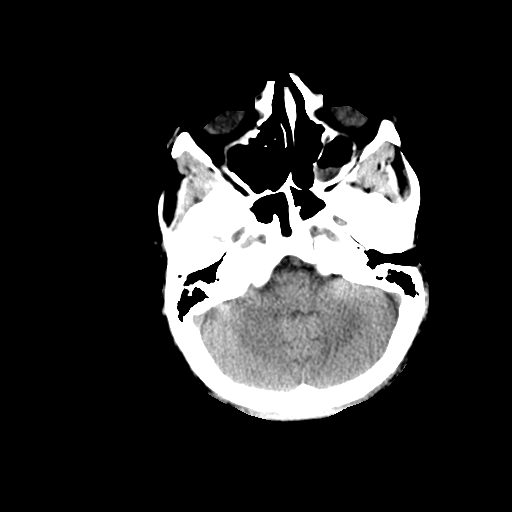
[im 10/33  brain]
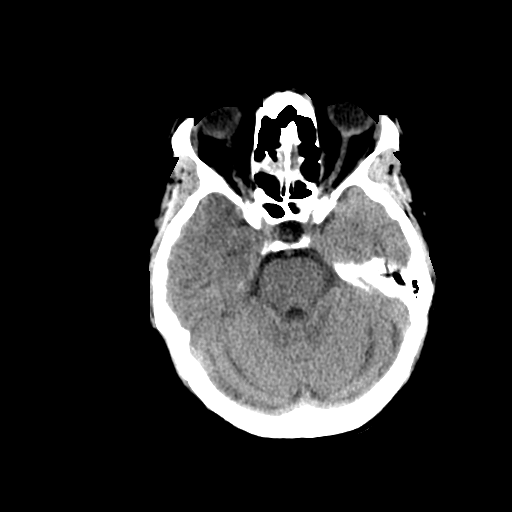
[im 15/33  brain]
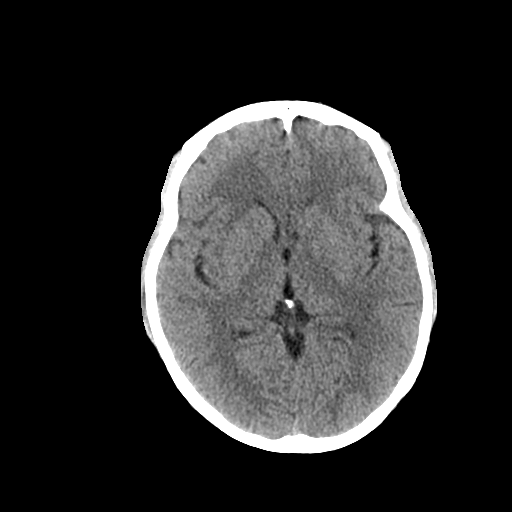
[im 18/33  brain]
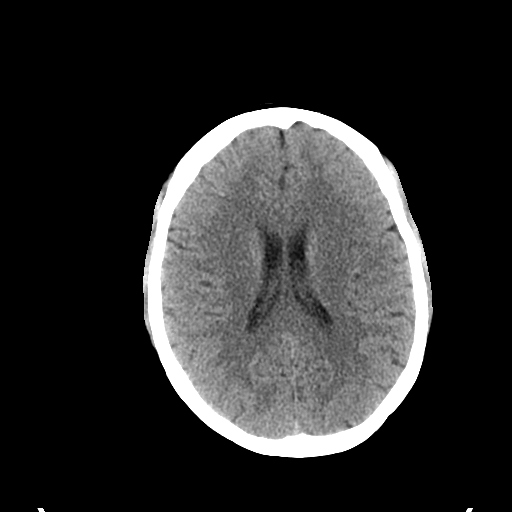
[im 18/33  bone]
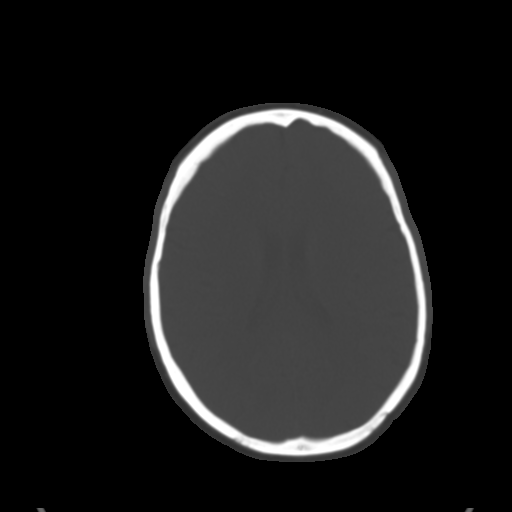
[im 23/33  brain]
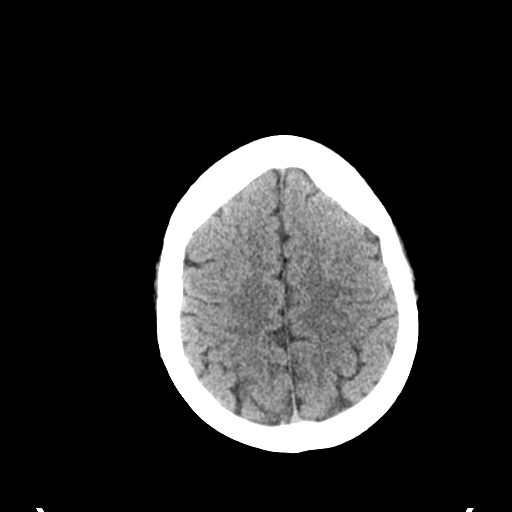
[im 26/33  brain]
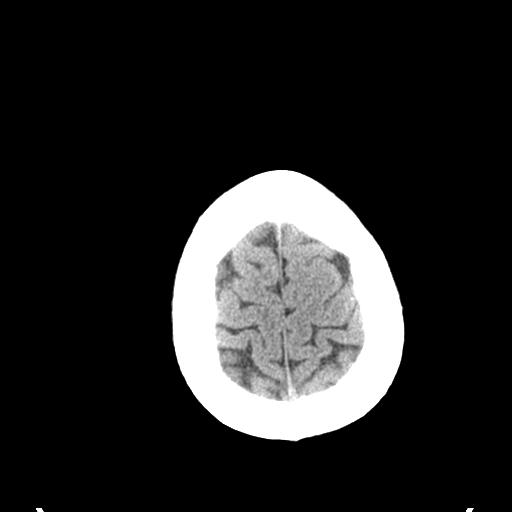
[im 30/33  brain]
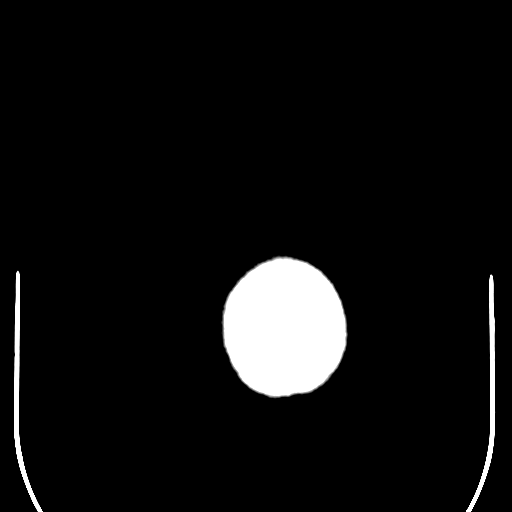

[Series 5: coronal soft tissue · coronal · 0.36mm/px · 3 of 66 slices shown]
[im 22/66  brain]
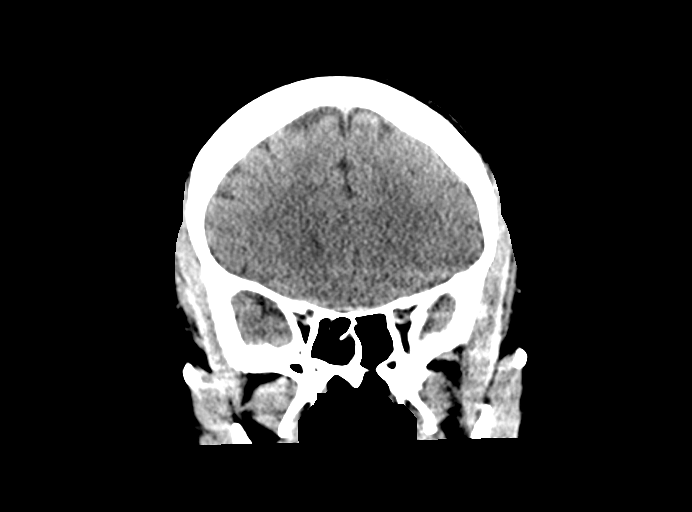
[im 29/66  brain]
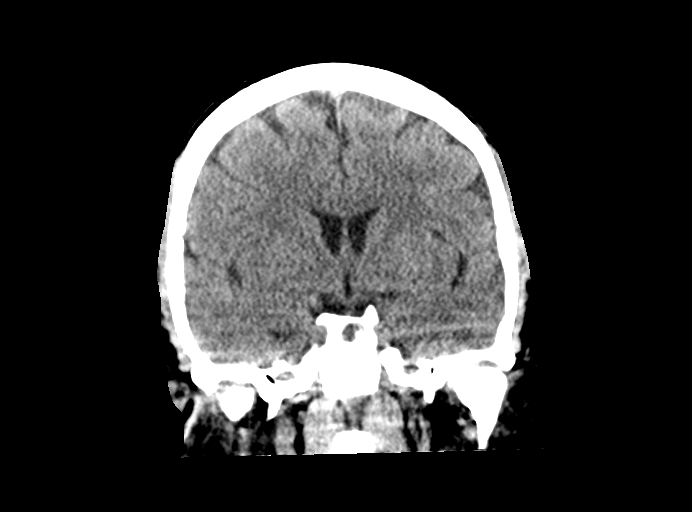
[im 37/66  brain]
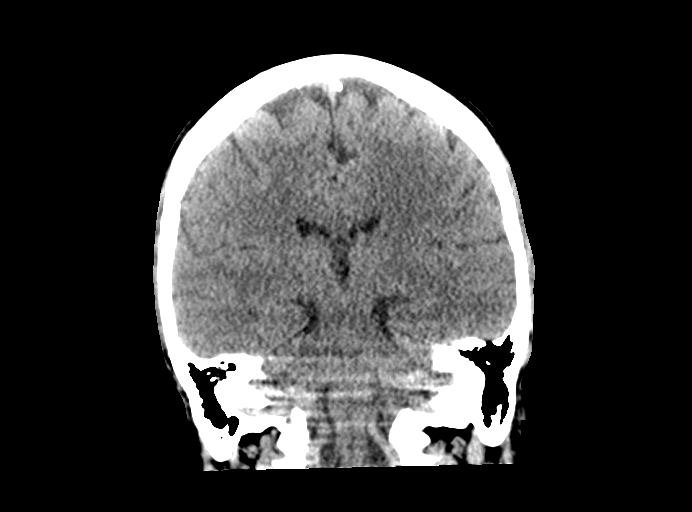

[Series 6: sagittal soft tissue · sagittal · 0.31mm/px · 3 of 54 slices shown]
[im 18/54  brain]
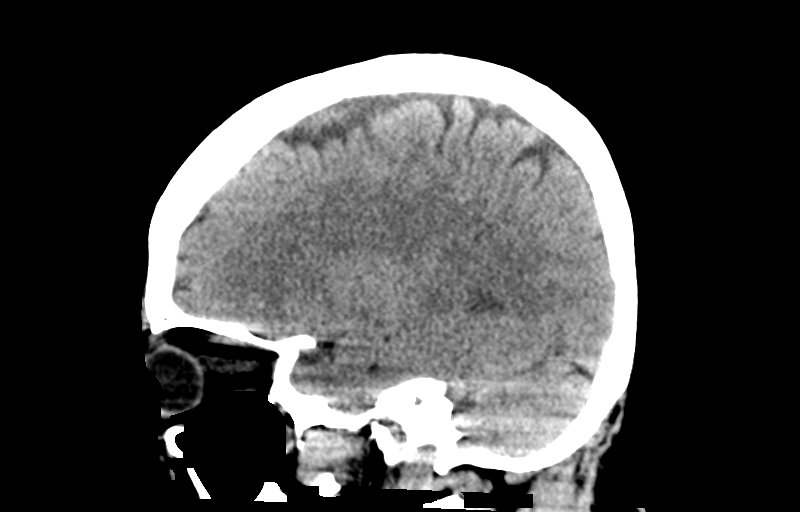
[im 27/54  brain]
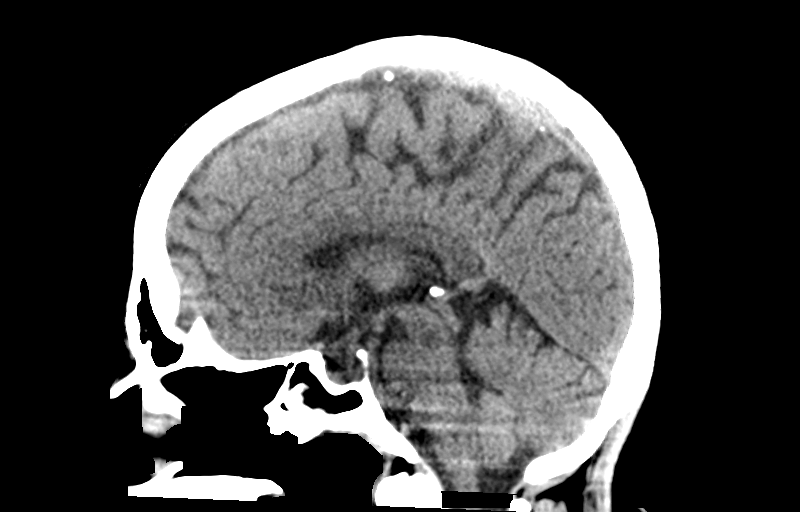
[im 36/54  brain]
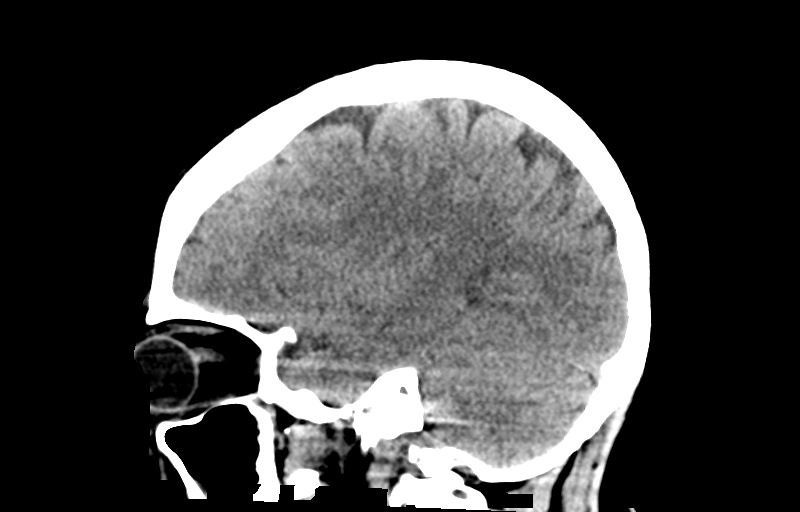

[14 of 47 positions shown; findings below may reference images not displayed]

FINDINGS: Brain: No evidence of acute infarction, hemorrhage, hydrocephalus,
extra-axial collection or mass lesion/mass effect.

Vascular: No hyperdense vessel or unexpected calcification.

Skull: Normal. Negative for fracture or focal lesion.

Sinuses/Orbits: Orbits are normal symmetric. Paranasal sinuses are
well developed and demonstrate opacification over the ethmoid air
cells and maxillary sinuses compatible chronic inflammatory change.
Mastoid air cells are clear.

Other: None.
IMPRESSION: 1. No acute findings.
2. Chronic sinus inflammatory change.

## 2024-01-22 ENCOUNTER — Emergency Department (HOSPITAL_COMMUNITY): Admission: EM | Admit: 2024-01-22 | Discharge: 2024-01-23 | Discharge status: Against Medical Advice

## 2024-01-22 ENCOUNTER — Encounter (HOSPITAL_COMMUNITY): Payer: Self-pay

## 2024-01-22 ENCOUNTER — Other Ambulatory Visit: Payer: Self-pay

## 2024-01-22 ENCOUNTER — Emergency Department (HOSPITAL_COMMUNITY)

## 2024-01-22 DIAGNOSIS — R002 Palpitations: Secondary | ICD-10-CM | POA: Diagnosis not present

## 2024-01-22 DIAGNOSIS — R079 Chest pain, unspecified: Secondary | ICD-10-CM | POA: Diagnosis present

## 2024-01-22 DIAGNOSIS — R0602 Shortness of breath: Secondary | ICD-10-CM | POA: Diagnosis not present

## 2024-01-22 DIAGNOSIS — M7989 Other specified soft tissue disorders: Secondary | ICD-10-CM | POA: Diagnosis not present

## 2024-01-22 DIAGNOSIS — Z5321 Procedure and treatment not carried out due to patient leaving prior to being seen by health care provider: Secondary | ICD-10-CM | POA: Insufficient documentation

## 2024-01-22 LAB — CBC
HCT: 39.4 % (ref 36.0–46.0)
Hemoglobin: 13.1 g/dL (ref 12.0–15.0)
MCH: 28.7 pg (ref 26.0–34.0)
MCHC: 33.2 g/dL (ref 30.0–36.0)
MCV: 86.2 fL (ref 80.0–100.0)
Platelets: 335 K/uL (ref 150–400)
RBC: 4.57 MIL/uL (ref 3.87–5.11)
RDW: 13.2 % (ref 11.5–15.5)
WBC: 9.8 K/uL (ref 4.0–10.5)
nRBC: 0 % (ref 0.0–0.2)

## 2024-01-22 LAB — BASIC METABOLIC PANEL WITH GFR
Anion gap: 11 (ref 5–15)
BUN: 11 mg/dL (ref 6–20)
CO2: 25 mmol/L (ref 22–32)
Calcium: 9.6 mg/dL (ref 8.9–10.3)
Chloride: 102 mmol/L (ref 98–111)
Creatinine, Ser: 1.17 mg/dL — ABNORMAL HIGH (ref 0.44–1.00)
GFR, Estimated: 56 mL/min — ABNORMAL LOW (ref >60)
Glucose, Bld: 111 mg/dL — ABNORMAL HIGH (ref 70–99)
Potassium: 4 mmol/L (ref 3.5–5.1)
Sodium: 138 mmol/L (ref 135–145)

## 2024-01-22 LAB — TROPONIN I (HIGH SENSITIVITY): Troponin I (High Sensitivity): 6 ng/L (ref <18)

## 2024-01-22 NOTE — ED Provider Triage Note (Signed)
 Emergency Medicine Provider Triage Evaluation Note  Melody Salas , a 53 y.o. female  was evaluated in triage.  Pt complains of chest pain, heart skipping beats, as well as leg and hand swelling for 1 week. Patient does endorse some shortness of breath. Patient does state that she has consistently been drinking soda and tea at work and believes if this may be possible cause. Uncle passed away yesterday at home and wants to be checked out. On hydrochlorothiazide outpatient, does state that diet has not been the best, has been eating fast food and chinese.   Review of Systems  Positive: Chest pain, palpitations vs. Heart skipping beats,  Negative: Fever, chills, abdominal pain, nausea, vomiting  Physical Exam  BP 121/72   Pulse 73   Temp 98.2 F (36.8 C) (Oral)   Resp 16   Ht 5' 6 (1.676 m)   Wt 99.8 kg   SpO2 99%   BMI 35.51 kg/m  Gen:   Awake, no distress   Resp:  Normal effort  MSK:   Moves extremities without difficulty  Other:  Mild swelling present to BLE, patient currently denies any pain at time of exam, talking in full sentences on room air  Medical Decision Making  Medically screening exam initiated at 8:10 PM.  Appropriate orders placed.  RUBYLEE ZAMARRIPA was informed that the remainder of the evaluation will be completed by another provider, this initial triage assessment does not replace that evaluation, and the importance of remaining in the ED until their evaluation is complete.  Orders: CBC, BMP, EKG, chest x-ray, POC urine preg, troponin   Janetta Terrall FALCON, PA-C 01/22/24 2019

## 2024-01-22 NOTE — ED Triage Notes (Signed)
 Pt c/o intermittent CP, palpitations, and leg and hand swelling x 1 week; endorses some sob; denies nausea; denies pain currently
